# Patient Record
Sex: Female | Born: 1985 | Race: White | Hispanic: No | Marital: Single | Smoking: Light tobacco smoker
Health system: Southern US, Community
[De-identification: ages and names within clinical notes are randomized; demographics above are authoritative.]

## PROBLEM LIST (undated history)

## (undated) DIAGNOSIS — R87629 Unspecified abnormal cytological findings in specimens from vagina: Secondary | ICD-10-CM

## (undated) DIAGNOSIS — F99 Mental disorder, not otherwise specified: Secondary | ICD-10-CM

## (undated) DIAGNOSIS — D649 Anemia, unspecified: Secondary | ICD-10-CM

## (undated) DIAGNOSIS — B009 Herpesviral infection, unspecified: Secondary | ICD-10-CM

## (undated) HISTORY — DX: Anemia, unspecified: D64.9

## (undated) HISTORY — DX: Unspecified abnormal cytological findings in specimens from vagina: R87.629

## (undated) HISTORY — DX: Herpesviral infection, unspecified: B00.9

## (undated) HISTORY — PX: NO PAST SURGERIES: SHX2092

## (undated) HISTORY — DX: Mental disorder, not otherwise specified: F99

---

## 2005-05-18 ENCOUNTER — Emergency Department: Payer: Self-pay | Admitting: Emergency Medicine

## 2005-08-17 ENCOUNTER — Emergency Department: Payer: Self-pay | Admitting: Unknown Physician Specialty

## 2005-12-10 ENCOUNTER — Emergency Department: Payer: Self-pay | Admitting: Emergency Medicine

## 2005-12-30 ENCOUNTER — Ambulatory Visit: Payer: Self-pay | Admitting: Internal Medicine

## 2006-04-29 ENCOUNTER — Emergency Department: Payer: Self-pay | Admitting: Emergency Medicine

## 2006-05-24 ENCOUNTER — Emergency Department: Payer: Self-pay | Admitting: Emergency Medicine

## 2006-07-27 ENCOUNTER — Emergency Department: Payer: Self-pay | Admitting: Emergency Medicine

## 2006-11-09 ENCOUNTER — Emergency Department: Payer: Self-pay | Admitting: Emergency Medicine

## 2007-05-29 ENCOUNTER — Ambulatory Visit: Payer: Self-pay

## 2007-11-05 ENCOUNTER — Other Ambulatory Visit: Payer: Self-pay

## 2007-11-05 ENCOUNTER — Inpatient Hospital Stay: Payer: Self-pay

## 2009-09-09 ENCOUNTER — Emergency Department: Payer: Self-pay | Admitting: Emergency Medicine

## 2009-09-11 ENCOUNTER — Emergency Department: Payer: Self-pay | Admitting: Emergency Medicine

## 2009-11-03 ENCOUNTER — Emergency Department: Payer: Self-pay | Admitting: Emergency Medicine

## 2009-12-16 ENCOUNTER — Observation Stay: Payer: Self-pay

## 2010-02-20 ENCOUNTER — Observation Stay: Payer: Self-pay

## 2010-03-10 ENCOUNTER — Observation Stay: Payer: Self-pay

## 2010-03-25 ENCOUNTER — Inpatient Hospital Stay: Payer: Self-pay

## 2010-06-09 ENCOUNTER — Emergency Department: Payer: Self-pay | Admitting: Emergency Medicine

## 2010-07-10 ENCOUNTER — Emergency Department: Payer: Self-pay | Admitting: Emergency Medicine

## 2011-10-17 ENCOUNTER — Emergency Department: Payer: Self-pay | Admitting: Emergency Medicine

## 2011-10-22 ENCOUNTER — Emergency Department: Payer: Self-pay | Admitting: Emergency Medicine

## 2011-11-27 LAB — HM PAP SMEAR

## 2012-02-26 ENCOUNTER — Ambulatory Visit: Payer: Self-pay | Admitting: Internal Medicine

## 2012-07-10 ENCOUNTER — Emergency Department: Payer: Self-pay | Admitting: Emergency Medicine

## 2012-07-10 LAB — COMPREHENSIVE METABOLIC PANEL
Anion Gap: 10 (ref 7–16)
BUN: 8 mg/dL (ref 7–18)
Bilirubin,Total: 0.3 mg/dL (ref 0.2–1.0)
Chloride: 101 mmol/L (ref 98–107)
Co2: 27 mmol/L (ref 21–32)
Creatinine: 0.65 mg/dL (ref 0.60–1.30)
EGFR (African American): >60
EGFR (Non-African Amer.): >60
Potassium: 3.5 mmol/L (ref 3.5–5.1)
SGOT(AST): 52 U/L — ABNORMAL HIGH (ref 15–37)
SGPT (ALT): 44 U/L
Total Protein: 8.4 g/dL — ABNORMAL HIGH (ref 6.4–8.2)

## 2012-07-10 LAB — CBC WITH DIFFERENTIAL/PLATELET
Basophil #: 0 10*3/uL (ref 0.0–0.1)
Eosinophil %: 2.4 %
HCT: 38 % (ref 35.0–47.0)
Lymphocyte #: 2.3 10*3/uL (ref 1.0–3.6)
MCH: 29.6 pg (ref 26.0–34.0)
MCV: 89 fL (ref 80–100)
Monocyte %: 6.3 %
Neutrophil #: 5.9 10*3/uL (ref 1.4–6.5)
RBC: 4.27 10*6/uL (ref 3.80–5.20)
RDW: 11.9 % (ref 11.5–14.5)
WBC: 9 10*3/uL (ref 3.6–11.0)

## 2012-10-28 ENCOUNTER — Ambulatory Visit: Payer: Self-pay | Admitting: Physician Assistant

## 2013-08-17 ENCOUNTER — Emergency Department: Payer: Self-pay | Admitting: Emergency Medicine

## 2013-08-17 LAB — URINALYSIS, COMPLETE
Blood: NEGATIVE
Glucose,UR: NEGATIVE mg/dL (ref 0–75)
Ketone: NEGATIVE
Squamous Epithelial: 1
WBC UR: <1 /HPF (ref 0–5)

## 2013-08-17 LAB — CBC
MCHC: 34.8 g/dL (ref 32.0–36.0)
RBC: 3.97 10*6/uL (ref 3.80–5.20)
RDW: 13.2 % (ref 11.5–14.5)
WBC: 12.9 10*3/uL — ABNORMAL HIGH (ref 3.6–11.0)

## 2013-10-20 ENCOUNTER — Observation Stay: Payer: Self-pay

## 2013-10-20 LAB — URINALYSIS, COMPLETE
Bilirubin,UR: NEGATIVE
Ketone: NEGATIVE
Protein: NEGATIVE

## 2013-11-05 ENCOUNTER — Observation Stay: Payer: Self-pay

## 2013-11-05 LAB — URINALYSIS, COMPLETE
Bilirubin,UR: NEGATIVE
Blood: NEGATIVE
Ketone: NEGATIVE
Nitrite: NEGATIVE
Ph: 6 (ref 4.5–8.0)
RBC,UR: 3 /HPF (ref 0–5)
Specific Gravity: 1.017 (ref 1.003–1.030)
Squamous Epithelial: 10

## 2014-01-10 ENCOUNTER — Inpatient Hospital Stay: Payer: Self-pay | Admitting: Obstetrics and Gynecology

## 2014-01-10 LAB — CBC WITH DIFFERENTIAL/PLATELET
Basophil #: 0 10*3/uL (ref 0.0–0.1)
Basophil %: 0.4 %
Eosinophil #: 0.1 10*3/uL (ref 0.0–0.7)
Eosinophil %: 0.9 %
HCT: 33.9 % — ABNORMAL LOW (ref 35.0–47.0)
HGB: 11.5 g/dL — ABNORMAL LOW (ref 12.0–16.0)
Lymphocyte #: 2 10*3/uL (ref 1.0–3.6)
Lymphocyte %: 20.8 %
MCH: 27.9 pg (ref 26.0–34.0)
MCHC: 33.8 g/dL (ref 32.0–36.0)
MCV: 83 fL (ref 80–100)
MONOS PCT: 4.8 %
Monocyte #: 0.5 x10 3/mm (ref 0.2–0.9)
NEUTROS PCT: 73.1 %
Neutrophil #: 7 10*3/uL — ABNORMAL HIGH (ref 1.4–6.5)
Platelet: 162 10*3/uL (ref 150–440)
RBC: 4.11 10*6/uL (ref 3.80–5.20)
RDW: 15.1 % — ABNORMAL HIGH (ref 11.5–14.5)
WBC: 9.6 10*3/uL (ref 3.6–11.0)

## 2014-01-10 LAB — GLUCOSE, RANDOM: Glucose: 74 mg/dL (ref 65–99)

## 2014-01-11 LAB — HEMATOCRIT: HCT: 33.1 % — ABNORMAL LOW (ref 35.0–47.0)

## 2014-04-11 ENCOUNTER — Ambulatory Visit: Payer: Self-pay | Admitting: Internal Medicine

## 2014-05-22 IMAGING — US ABDOMEN ULTRASOUND LIMITED
1 series · 14 of 25 positions shown · non-contrast
Comparison: none

REASON FOR EXAM: RUQ gallbladder  bloating nausea and fullness
COMMENTS:

[Series 1: abdomen ultrasound limited · 0.31mm/px · 14 of 46 slices shown]
[im 1/46]
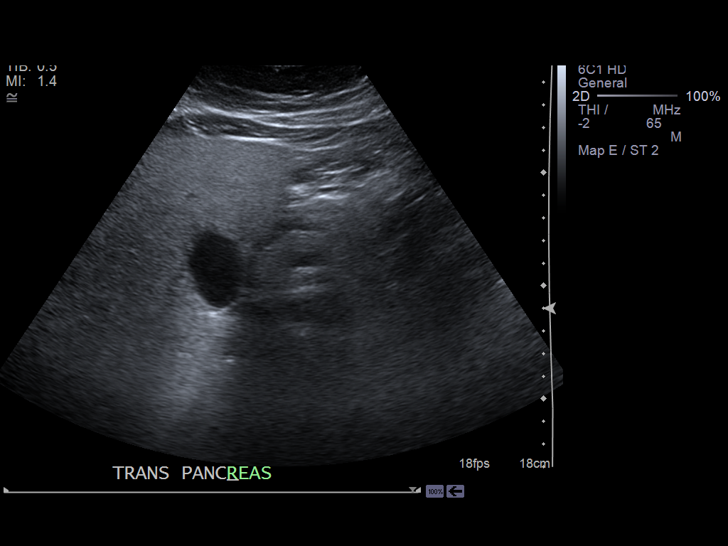
[im 4/46]
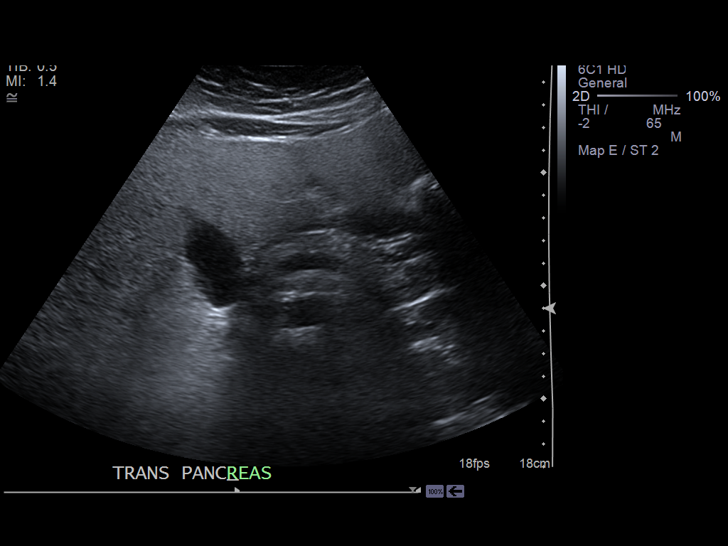
[im 8/46]
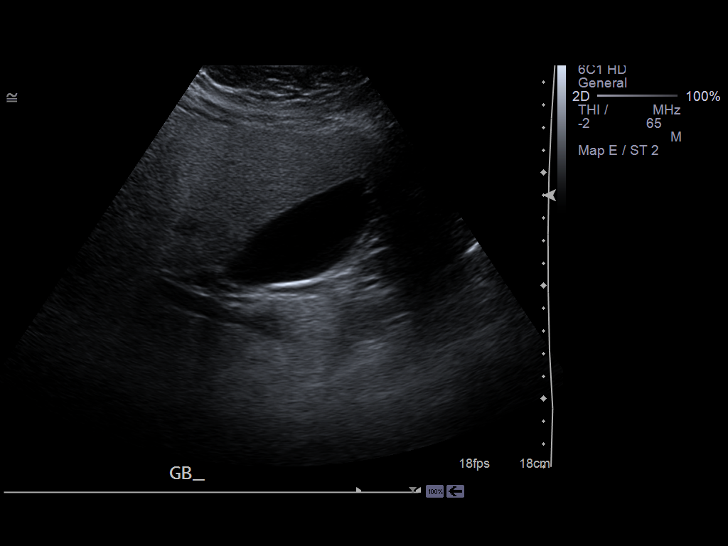
[im 12/46]
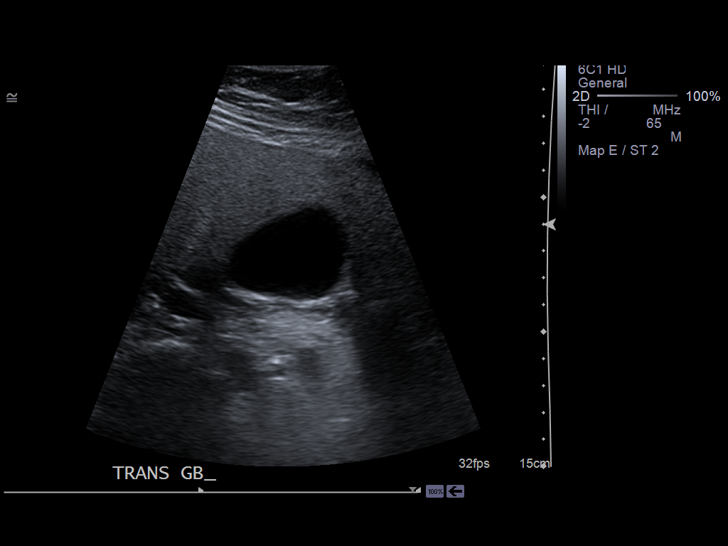
[im 16/46]
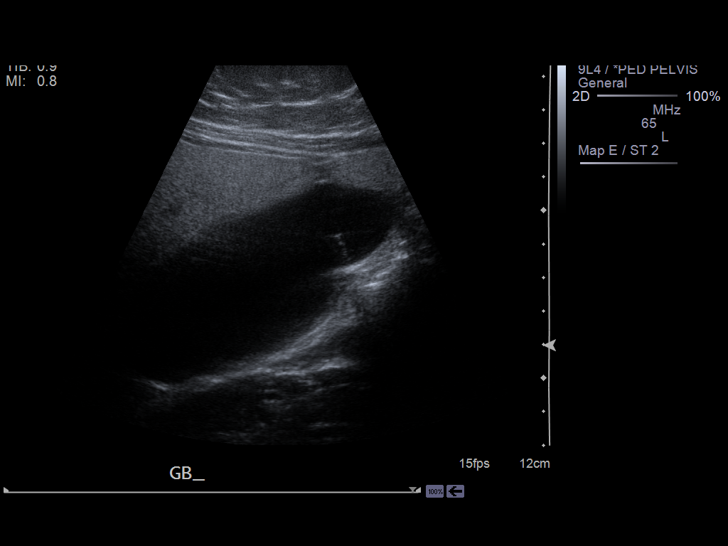
[im 17/46]
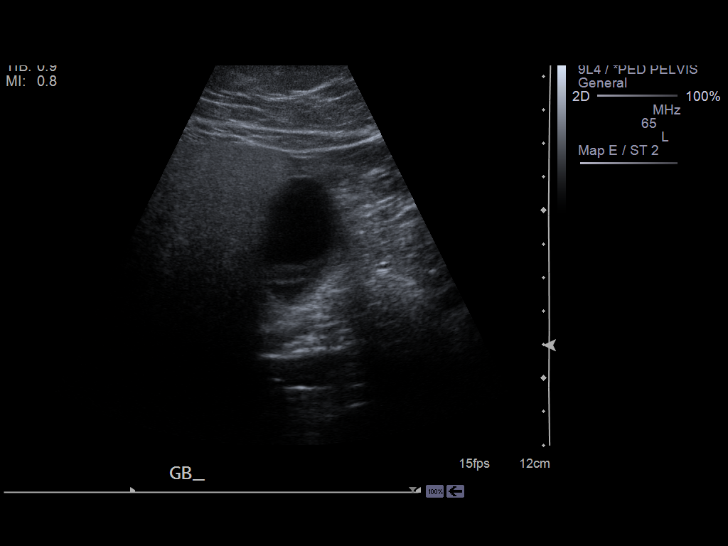
[im 21/46]
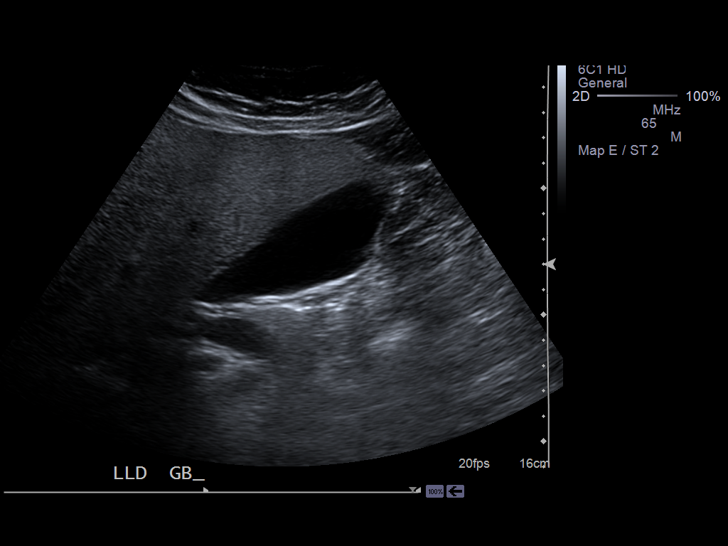
[im 25/46]
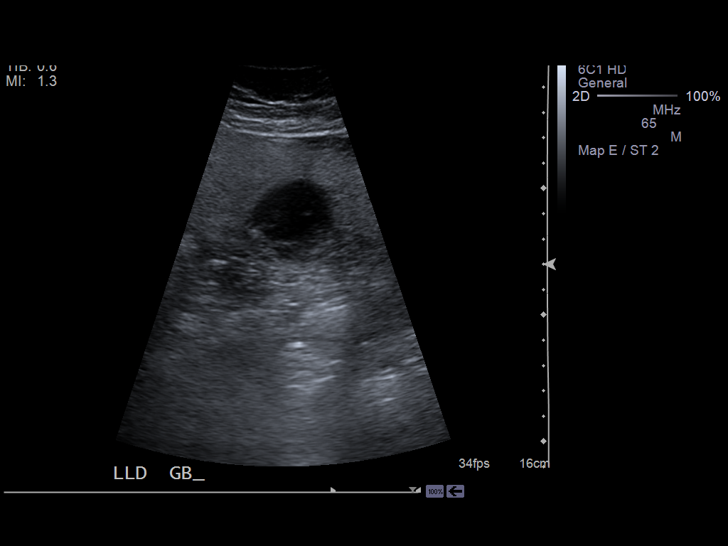
[im 29/46]
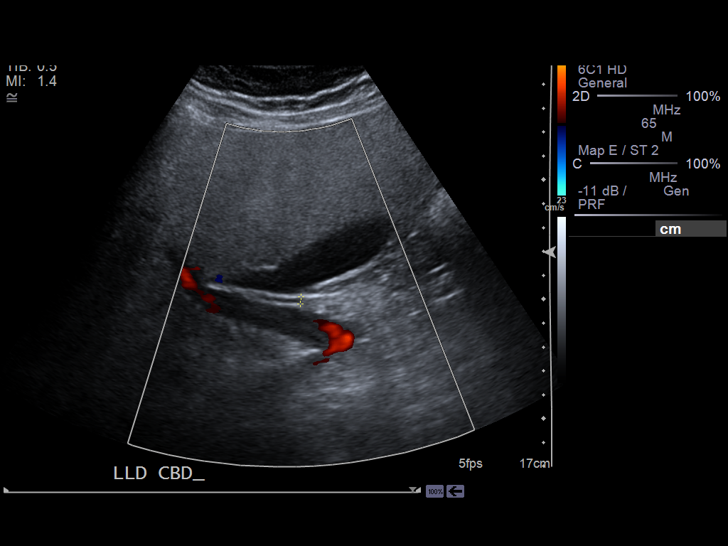
[im 31/46]
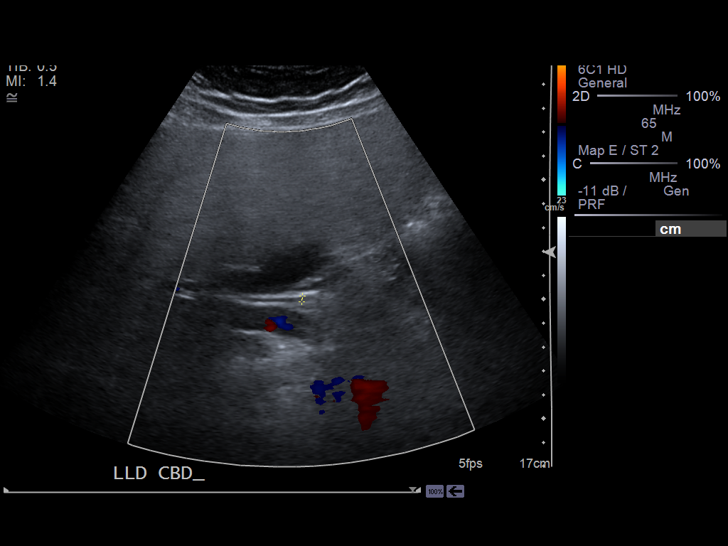
[im 34/46]
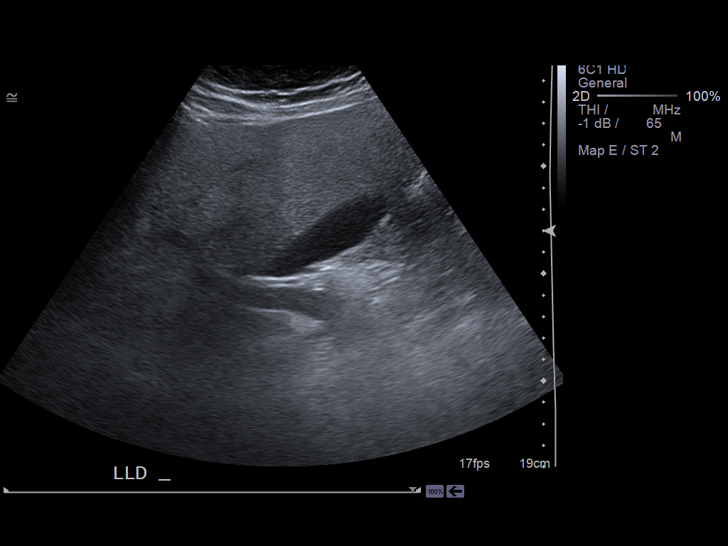
[im 38/46]
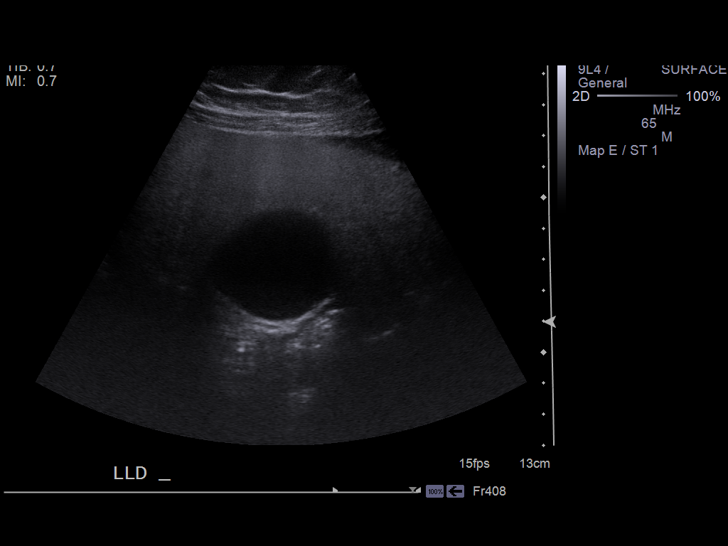
[im 42/46]
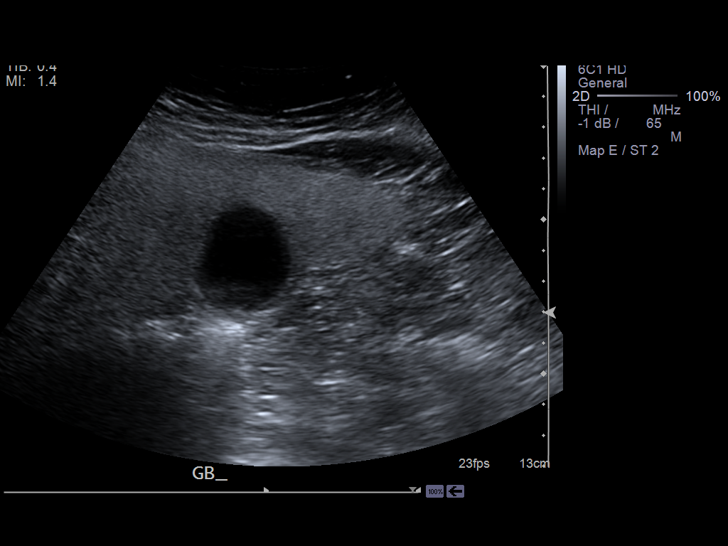
[im 46/46]
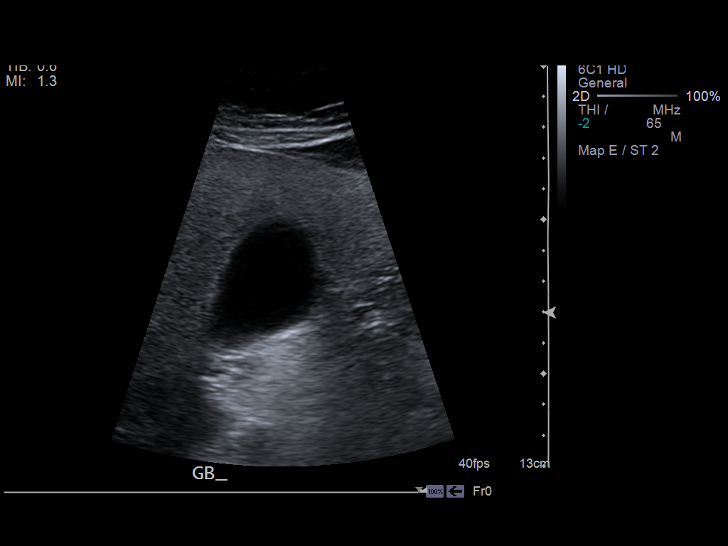

[14 of 25 positions shown; findings below may reference images not displayed]

PROCEDURE:     US  - US ABDOMEN LIMITED SURVEY  - October 28, 2012  [DATE]

RESULT:     Limited right upper quadrant abdominal sonogram is performed.
The liver shows a increased echotexture. The common bile duct is 3.0 mm.
Portal venous flow is normal. Gallbladder wall thickness is 1.6 mm. There
appears to be a possible prominent fold in the gallbladder near the fundus.
A phrygian cap is not excluded. The visualized pancreatic head appears to be
unremarkable. There is no pericholecystic fluid or sonographic Murphy's sign.
IMPRESSION: No significant abnormality. No stones are identified.
Probable fatty infiltration of the liver.

[REDACTED]

## 2014-05-22 IMAGING — NM NUCLEAR MEDICINE HEPATOHBILIARY INCLUDE GB
2 series · 14 of 14 positions shown · non-contrast
Comparison: none

REASON FOR EXAM: RUQ gallbladder  bloating nausea and fullness
COMMENTS:

[Series 1000: gallbladder ef · 4.80mm/px · 6 of 30 frames shown]
[frame 3/30]
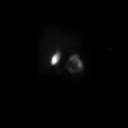
[frame 8/30]
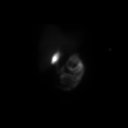
[frame 13/30]
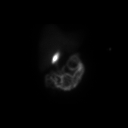
[frame 18/30]
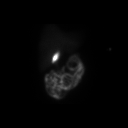
[frame 23/30]
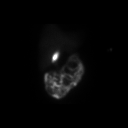
[frame 28/30]
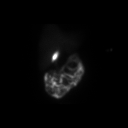

[Series 1000: gallbladder statics · 4.80mm/px · 8 of 8 slices shown]
[im 1/8]
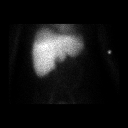
[im 2/8]
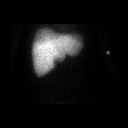
[im 3/8]
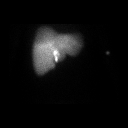
[im 4/8]
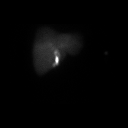
[im 5/8]
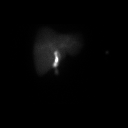
[im 6/8]
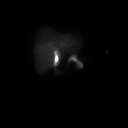
[im 7/8]
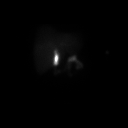
[im 8/8]
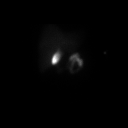

[14 of 14 positions shown; findings below may reference images not displayed]

PROCEDURE:     NM  - NM HEPATO WITH GB EJECT FRACTION  - October 28, 2012  [DATE]

RESULT:     The patient received a dose of 8.082 mCi of technetium 99 M
Choletec. There is prompt extraction of tracer from the blood pool by the
liver. Common bile duct activity is present 10 minutes. At 15 minutes there
is early common bile duct and gallbladder localization. Small bowel
localization is present by 20 minutes. There is progressive filling of the
gallbladder throughout the study. There is clearing of activity from the
hepatic parenchyma. A 1.5 uCi intravenous dose of sincalide was administered
over 30 minutes. Gallbladder ejection fraction is 64 stent. The patient
experienced minimal discomfort during the sincalide administration measuring
2 on scale of 1 to 10. Gallbladder ejection fraction is 64%.
IMPRESSION: Normal-appearing hepatobiliary scan. Gallbladder ejection
fraction is 64% in response to sincalide infusion.

[REDACTED]

## 2015-05-08 NOTE — H&P (Signed)
L&D Evaluation:  History:  HPI 29yo SWF presents at 7342w3d with SPROM of clear fluid at 930am today. [id number] with previous 36 week delivery of well infant. PNC complicated by GDM and noncompliant f/u and education for GDM. Last u/s revealed EFW 84% with AFI 11cm.   Presents with leaking fluid   Patient's Medical History other  Anxiety with depression   Patient's Surgical History none   Medications Pre Natal Vitamins  Tylenol (Acetaminophen)  zofran prn, Phenergan prn,   Allergies NKDA   Social History none   Family History Non-Contributory   ROS:  ROS All systems were reviewed.  HEENT, CNS, GI, GU, Respiratory, CV, Renal and Musculoskeletal systems were found to be normal.   Exam:  Vital Signs stable   Urine Protein not completed   General no apparent distress   Mental Status clear   Chest clear   Heart normal sinus rhythm   Abdomen gravid, non-tender   Estimated Fetal Weight Average for gestational age   Fetal Position vtx   Back no CVAT   Edema no edema   Mebranes Ruptured   Description clear   FHT normal rate with no decels   Fetal Heart Rate 125   Ucx irregular   Ucx Frequency 5 min   Length of each Contraction 60 seconds   Ucx Pain Scale 8   Impression:  Impression PPROM   Plan:  Plan monitor contractions and for cervical change, antibiotics for GBBS prophylaxis, fluids   Electronic Signatures: Ulyses AmorBurr, Ladean Steinmeyer N (CNM)  (Signed 13-Jan-15 13:36)  Authored: L&D Evaluation   Last Updated: 13-Jan-15 13:36 by Ulyses AmorBurr, Kaleisha Bhargava N (CNM)

## 2015-05-08 NOTE — H&P (Signed)
L&D Evaluation:  History:  HPI G2P1 at 3425 weeks gestation   Presents with abdominal pain   Patient's Medical History Recurrent UTI;Anxiety   Medications Pre Natal Vitamins   Allergies NKDA   Social History none   Family History Non-Contributory   ROS:  ROS No   General normal   HEENT normal   CNS normal   GI normal, constipation   GU normal, no uti sxs   Resp normal   CV normal   Renal normal   MS normal   Exam:  Vital Signs stable   Urine Protein negative dipstick, +1 Leucocytes; 1+ Bacteria   General no apparent distress   Mental Status clear   Heart normal sinus rhythm   Abdomen gravid, non-tender   Back no CVAT   Edema no edema   Pelvic no external lesions, cervix closed and thick   Mebranes Intact   FHT normal rate with no decels   FHT Description NST Reactive   Ucx absent   Skin dry   Lymph no lymphadenopathy   Impression:  Impression reactive NST, 25 week Intrauterine pregnancy; Non acute abdominal pain   Plan:  Plan UA, discharge, Urine culture   Follow Up Appointment already scheduled   Electronic Signatures: DeFrancesco, Prentice DockerMartin A (MD)  (Signed [phone]-Nov-14 21:54)  Authored: L&D Evaluation   Last Updated: 08-Nov-14 21:54 by DeFrancesco, Prentice DockerMartin A (MD)

## 2017-01-31 ENCOUNTER — Encounter: Payer: Self-pay | Admitting: Emergency Medicine

## 2017-01-31 ENCOUNTER — Emergency Department
Admission: EM | Admit: 2017-01-31 | Discharge: 2017-01-31 | Disposition: A | Discharge status: Home / Self Care | Payer: Self-pay | Attending: Emergency Medicine | Admitting: Emergency Medicine

## 2017-01-31 DIAGNOSIS — R05 Cough: Secondary | ICD-10-CM | POA: Insufficient documentation

## 2017-01-31 DIAGNOSIS — R69 Illness, unspecified: Secondary | ICD-10-CM

## 2017-01-31 DIAGNOSIS — J111 Influenza due to unidentified influenza virus with other respiratory manifestations: Secondary | ICD-10-CM

## 2017-01-31 DIAGNOSIS — R5383 Other fatigue: Secondary | ICD-10-CM | POA: Insufficient documentation

## 2017-01-31 DIAGNOSIS — R509 Fever, unspecified: Secondary | ICD-10-CM | POA: Insufficient documentation

## 2017-01-31 DIAGNOSIS — R0981 Nasal congestion: Secondary | ICD-10-CM | POA: Insufficient documentation

## 2017-01-31 DIAGNOSIS — F17210 Nicotine dependence, cigarettes, uncomplicated: Secondary | ICD-10-CM | POA: Insufficient documentation

## 2017-01-31 DIAGNOSIS — M791 Myalgia: Secondary | ICD-10-CM | POA: Insufficient documentation

## 2017-01-31 MED ORDER — OSELTAMIVIR PHOSPHATE 75 MG PO CAPS: 75.0000 mg | ORAL_CAPSULE | Freq: Two times a day (BID) | ORAL | 0 refills | Status: DC

## 2017-01-31 NOTE — ED Provider Notes (Signed)
[facility] Emergency Department Provider Note  ____________________________________________  Time seen: Approximately 11:30 AM  I have reviewed the triage vital signs and the nursing notes.   HISTORY  Chief Complaint Cough    HPI Allison Delgado is a 31 y.o. female , NAD, presents to the emergency department for evaluation of 3 day history of flu like symptoms. States over the last 2-3 days she has had fatigue, mild upper body aches, nasal congestion, runny nose and a cough. Did take Alka-Seltzer cold medication this morning which seemed to help some. Did feel that she had a fever over the last couple of days with chills. Denies any chest pain, shortness of breath or wheezing. No abdominal pain, nausea or vomiting but does note that she had increased urinary frequency and dysuria approximately one week ago that resolved after taking over-the-counter AZO. Has had no further episodes of urinary frequency or dysuria. Has not noted any hematuria. 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 Is concerned that she has flu and would like treatment.   History reviewed. No pertinent past medical history.  There are no active problems to display for this patient.   History reviewed. No pertinent surgical history.  Prior to Admission medications   Medication Sig Start Date End Date Taking? Authorizing Provider  oseltamivir (TAMIFLU) 75 MG capsule Take 1 capsule (75 mg total) by mouth 2 (two) times daily. 01/31/17   Tria Noguera L Susa Bones, PA-C    Allergies Patient has no known allergies.  No family history on file.  Social History Social History  Substance Use Topics  . Smoking status: Current Every Day Smoker    Packs/day: 0.10    Types: Cigarettes  . Smokeless tobacco: Not on file  . Alcohol use Not on file     Review of Systems  Constitutional: Positive subjective fevers, chills, fatigue. Eyes: No visual  changes. No discharge ENT: Positive nasal congestion, runny nose, ear pressure. No sore throat. Cardiovascular: No chest pain. Respiratory: Positive nonproductive cough. No shortness of breath. No wheezing.  Gastrointestinal: No abdominal pain.  No nausea, vomiting.  No diarrhea.  No constipation. Genitourinary: Negative for dysuria. No hematuria. No urinary hesitancy, urgency or increased frequency. Musculoskeletal: Positive for general myalgias.  Skin: Negative for rash. Neurological: Negative for headaches. 10-point ROS otherwise negative.  ____________________________________________   PHYSICAL EXAM:  VITAL SIGNS: ED Triage Vitals  Enc Vitals Group     BP 01/31/17 1055 137/67     Pulse Rate 01/31/17 1055 100     Resp 01/31/17 1055 18     Temp 01/31/17 1055 98.1 F (36.7 C)     Temp Source 01/31/17 1055 Oral     SpO2 01/31/17 1055 98 %     Weight 01/31/17 1057 170 lb (77.1 kg)     Height 01/31/17 1057 4\' 11"  (1.499 m)     Head Circumference --      Peak Flow --      Pain Score 01/31/17 1057 6     Pain Loc --      Pain Edu? --      Excl. in GC? --      Constitutional: Alert and oriented. Well appearing and in no acute distress. Eyes: Conjunctivae are normal.  Head: Atraumatic. ENT:      Ears: TMs visualized bilaterally without erythema, effusion, bulging, perforation.      Nose: No congestion/rhinnorhea.      Mouth/Throat:  Mucous membranes are moist. Pharynx without erythema, swelling. Uvula is midline. Airway is patent. Clear postnasal drainage. Neck: Supple with full range of motion. Hematological/Lymphatic/Immunilogical: No cervical lymphadenopathy. Cardiovascular: Normal rate, regular rhythm. Normal S1 and S2.  Good peripheral circulation. Respiratory: Normal respiratory effort without tachypnea or retractions. Lungs CTAB with breath sounds noted in all lung fields. No wheeze, rhonchi, rales Neurologic:  Normal speech and language. No gross focal neurologic  deficits are appreciated.  Skin:  Skin is warm, dry and intact. No rash noted. Psychiatric: Mood and affect are normal. Speech and behavior are normal. Patient exhibits appropriate insight and judgement.   ____________________________________________   LABS  None ____________________________________________  EKG  None ____________________________________________  RADIOLOGY  None ____________________________________________    PROCEDURES  Procedure(s) performed: None   Procedures   Medications - No data to display   ____________________________________________   INITIAL IMPRESSION / ASSESSMENT AND PLAN / ED COURSE  Pertinent labs & imaging results that were available during my care of the patient were reviewed by me and considered in my medical decision making (see chart for details).     Patient's diagnosis is consistent with influenza-like illness. Had a discussion with the patient that her symptoms did not particularly following the 48 hour window in which Tamiflu would be effective. Patient states she would prefer to take the Tamiflu versus not at this time due to having children at home. Patient will be discharged home with prescriptions for Tamiflu to take as directed. May continue over-the-counter cold or cough remedies as needed. [facility] if symptoms persist past this treatment course. Patient is given ED precautions to return to the ED for any worsening or new symptoms.   ____________________________________________  FINAL CLINICAL IMPRESSION(S) / ED DIAGNOSES  Final diagnoses:  Influenza-like illness      NEW MEDICATIONS STARTED DURING THIS VISIT:  Discharge Medication List as of 01/31/2017 11:39 AM    START taking these medications   Details  oseltamivir (TAMIFLU) 75 MG capsule Take 1 capsule (75 mg total) by mouth 2 (two) times daily., Starting Sat 01/31/2017, Print             Ernestene KielJami L AudubonHagler,  PA-C 01/31/17 1250    Jennye MoccasinBrian S Quigley, MD 01/31/17 1256

## 2017-01-31 NOTE — ED Triage Notes (Signed)
Cough, body aches x 2 days

## 2017-09-13 ENCOUNTER — Encounter: Payer: Self-pay | Admitting: Emergency Medicine

## 2017-09-13 ENCOUNTER — Emergency Department
Admission: EM | Admit: 2017-09-13 | Discharge: 2017-09-13 | Disposition: A | Discharge status: Home / Self Care | Payer: Self-pay | Attending: Emergency Medicine | Admitting: Emergency Medicine

## 2017-09-13 DIAGNOSIS — N390 Urinary tract infection, site not specified: Secondary | ICD-10-CM | POA: Insufficient documentation

## 2017-09-13 NOTE — ED Notes (Signed)
Pt called for in lobby, BR, and outside building. Pt not found.

## 2017-09-13 NOTE — ED Triage Notes (Signed)
Pt arrives via [facility] with c/o recurrent UTI and multiple medical complaints. Pt is having concerns with multiple parts of her body including but not limited to her ears, tongue, legs, and head. Pt is ambulatory to triage and in NAD.

## 2018-07-06 ENCOUNTER — Telehealth: Payer: Self-pay | Admitting: Obstetrics and Gynecology

## 2018-07-06 NOTE — Telephone Encounter (Signed)
The patient called and stated that she would like to speak with a nurse in regards to a medication she is currently taking while pregnant. The patient also stated that she is planning on starting prenatal care with us, and that she was a previous patient here some years ago. No other information was disclosed. Please advise.

## 2018-07-07 NOTE — Telephone Encounter (Signed)
Called pt from the number on file [phone] and a female answer the phone and when asked for pt he stated that I had to wrong number.

## 2018-07-20 ENCOUNTER — Encounter: Payer: Self-pay | Admitting: Maternal Newborn

## 2020-02-10 ENCOUNTER — Ambulatory Visit (LOCAL_COMMUNITY_HEALTH_CENTER): Payer: Medicaid Other | Admitting: Physician Assistant

## 2020-02-10 ENCOUNTER — Encounter: Payer: Self-pay | Admitting: Physician Assistant

## 2020-02-10 ENCOUNTER — Ambulatory Visit: Payer: Medicaid Other | Admitting: Physician Assistant

## 2020-02-10 ENCOUNTER — Other Ambulatory Visit: Payer: Self-pay

## 2020-02-10 VITALS — BP 111/72 | Ht 59.25 in | Wt 162.0 lb

## 2020-02-10 DIAGNOSIS — Z3009 Encounter for other general counseling and advice on contraception: Secondary | ICD-10-CM

## 2020-02-10 DIAGNOSIS — Z113 Encounter for screening for infections with a predominantly sexual mode of transmission: Secondary | ICD-10-CM | POA: Diagnosis not present

## 2020-02-10 DIAGNOSIS — Z30012 Encounter for prescription of emergency contraception: Secondary | ICD-10-CM

## 2020-02-10 DIAGNOSIS — Z30011 Encounter for initial prescription of contraceptive pills: Secondary | ICD-10-CM | POA: Diagnosis not present

## 2020-02-10 DIAGNOSIS — O24419 Gestational diabetes mellitus in pregnancy, unspecified control: Secondary | ICD-10-CM | POA: Insufficient documentation

## 2020-02-10 LAB — WET PREP FOR TRICH, YEAST, CLUE
Trichomonas Exam: NEGATIVE
Yeast Exam: NEGATIVE

## 2020-02-10 MED ORDER — LEVONORGESTREL 1.5 MG PO TABS: 1.5000 mg | ORAL_TABLET | Freq: Once | ORAL | 0 refills | Status: AC

## 2020-02-10 MED ORDER — NORGESTIM-ETH ESTRAD TRIPHASIC 0.18/0.215/0.25 MG-35 MCG PO TABS
1.0000 | ORAL_TABLET | Freq: Every day | ORAL | 3 refills | Status: DC
Start: 2020-02-11 — End: 2020-05-29

## 2020-02-10 NOTE — Progress Notes (Signed)
[facility] Department STI clinic/screening visit  Subjective:  Allison Delgado is a 34 y.o. female being seen today for an STI screening visit. The patient reports they do not have symptoms.  Patient reports that they do not desire a pregnancy in the next year.   They reported they are interested in discussing contraception today.  Patient's last menstrual period was 02/04/2020 (approximate).   Patient has the following medical conditions:  There are no problems to display for this patient.   Chief Complaint  Patient presents with  . SEXUALLY TRANSMITTED DISEASE    HPI  Patient reports that she may have a retained tampon.  States she put in a tampon almost 48 hr ago and does not remember taking it out.  Would like to be checked to see if there is a tampon in her vagina and for infections.  States that she would also like Plan B and OCs to start.  See flowsheet for further details and programmatic requirements.    The following portions of the patient's history were reviewed and updated as appropriate: allergies, current medications, past medical history, past social history, past surgical history and problem list.  Objective:   Vitals:   02/10/20 1631  BP: 111/72  Weight: 162 lb (73.5 kg)  Height: 4' 11.25" (1.505 m)    Physical Exam Vitals reviewed.  Constitutional:      General: She is not in acute distress.    Appearance: Normal appearance. She is normal weight.  HENT:     Head: Normocephalic and atraumatic.     Comments: No nits, lice, or hair loss. No cervical, supraclavicular or axillary adenopathy.    Mouth/Throat:     Mouth: Mucous membranes are moist.     Pharynx: Oropharynx is clear. No oropharyngeal exudate or posterior oropharyngeal erythema.  Eyes:     Conjunctiva/sclera: Conjunctivae normal.  Pulmonary:     Effort: Pulmonary effort is normal.  Abdominal:     Palpations: Abdomen is soft. There is no mass.     Tenderness: There is no  abdominal tenderness. There is no guarding or rebound.  Genitourinary:    General: Normal vulva.     Rectum: Normal.     Comments: External genitalia/pubic area without nits, lice, edema, erythema, lesions and inguinal adenopathy. Vagina with normal mucosa and discharge. No tampon visualized or palpated in vagina. Cervix without visible lesions. Uterus firm, mobile, nt, no masses, no CMT, no adnexal tenderness or fullness. Musculoskeletal:     Cervical back: Neck supple. No tenderness.  Skin:    General: Skin is warm and dry.     Findings: No bruising, erythema, lesion or rash.     Comments: Multiple tattoos.  Neurological:     Mental Status: She is alert and oriented to person, place, and time.  Psychiatric:        Mood and Affect: Mood normal.        Behavior: Behavior normal.        Thought Content: Thought content normal.        Judgment: Judgment normal.      Assessment and Plan:  Tamara Monteith is a 34 y.o. female presenting to the [facility] Department for STI screening  1. Screening for STD (sexually transmitted disease) Patient into clinic without symptoms.  Declines blood work today. Rec condoms with all sex. Await test results.  Counseled that RN will call if needs to RTC for treatment once results are back. - WET PREP FOR TRICH,  YEAST, CLUE - Chlamydia/Gonorrhea [facility]  2. Encounter for counseling regarding contraception Counseled re:  Risks, benefits, and SE of all BCMs.  Patient states that she is only interested in OCs and nothing else. Reports that she previously did the best on Ortho Tri Cyclen. - levonorgestrel (PLAN B ONE-STEP) 1.5 MG tablet; Take 1 tablet (1.5 mg total) by mouth once for 1 dose.  Dispense: 1 tablet; Refill: 0 - Norgestimate-Ethinyl Estradiol Triphasic (ORTHO TRI-CYCLEN, 28,) 0.18/0.215/0.25 MG-35 MCG tablet; Take 1 tablet by mouth daily.  Dispense: 1 Package; Refill: 3  3. OCP (oral contraceptive pills) initiation Rx  for Ortho Tri Cyclen 28d 1 po daily at the same time each day with refills x 3 sent to patient's pharmacy of choice. Counseled patient to start these OCs on 02/11/2020, after taking ECP today. Rec condoms with all sex for 2 weeks and check OTC pregnancy test in 2 weeks, RTC if positive. - Norgestimate-Ethinyl Estradiol Triphasic (ORTHO TRI-CYCLEN, 28,) 0.18/0.215/0.25 MG-35 MCG tablet; Take 1 tablet by mouth daily.  Dispense: 1 Package; Refill: 3  4. Encounter for emergency contraceptive counseling and prescription Rx for Plan B sent to patient's pharmacy of choice. Written info on how to take and SE reviewed with and given to patient. RTC if pregnancy test in 2 weeks at home is positive. - levonorgestrel (PLAN B ONE-STEP) 1.5 MG tablet; Take 1 tablet (1.5 mg total) by mouth once for 1 dose.  Dispense: 1 tablet; Refill: 0     No follow-ups on file.  No future appointments.  Jerene Dilling, PA

## 2020-02-13 ENCOUNTER — Encounter: Payer: Self-pay | Admitting: Physician Assistant

## 2020-02-13 NOTE — Progress Notes (Signed)
Patient into clinic for IS visit and requested to get Plan B and start OCs.  Reported that she has tried several types of OC in the past and did best with Ortho Tri Cyclen.  Denies any chronic conditions, surgeries, smoking and history of migraines.  Per Centricity last RP at [facility] was in 2016, and patient denies changes to family history since then.  See IS document for vital signs and Rx.  Patient to RTC for RP when refills run out and will also need to have copy of pap to make sure when next due.

## 2020-02-22 ENCOUNTER — Telehealth: Payer: Self-pay | Admitting: Family Medicine

## 2020-02-22 NOTE — Telephone Encounter (Signed)
Per client, took Plan B 02/10/2020 and initiated ocps on 02/15/2020. Few hours after taking initial ocp had light vaginal spotting with heavier bleeding the next day requiring use of light panty liner. Also had menstrual cramps and bloated feeling. As 02/16/2020 progressed into evening, symptoms and bleeding resolved. Each night when takes ocp, begins spotting and above scenario occurs each day. Consult with Sadie Haber PA and client counseled that due to Plan B and pill initiation, may experience some irregular bleeding with first few pack of pills. Counseled could experience this irregular bleeding if Plan B not taken. Per Ms. Rolley Sims, client encouraged to continue ocp daily at same time every day. Client also to take home UPT this weekend as per discussion with provider ate 02/10/20 appt. Client to call clinic for any questions / concerns. Jossie Ng, RN

## 2020-05-28 ENCOUNTER — Other Ambulatory Visit: Payer: Self-pay | Admitting: Physician Assistant

## 2020-05-28 DIAGNOSIS — Z3009 Encounter for other general counseling and advice on contraception: Secondary | ICD-10-CM

## 2020-05-28 DIAGNOSIS — Z30011 Encounter for initial prescription of contraceptive pills: Secondary | ICD-10-CM

## 2020-05-29 NOTE — Telephone Encounter (Signed)
Patient seen for IS visit and given Plan B and OC start on 02/10/2020.  Last RP was 2016.  Needs RP visit prior to more OCs.

## 2020-06-27 ENCOUNTER — Other Ambulatory Visit: Payer: Self-pay | Admitting: Physician Assistant

## 2020-06-27 DIAGNOSIS — Z30011 Encounter for initial prescription of contraceptive pills: Secondary | ICD-10-CM

## 2020-06-27 DIAGNOSIS — Z3009 Encounter for other general counseling and advice on contraception: Secondary | ICD-10-CM

## 2020-06-28 NOTE — Telephone Encounter (Signed)
Patient seen through IS clinic in 01/2020 and given Plan B and Rx for OC with refills.  Will OK one refill but patient needs in-person visit for RP before any further refills.

## 2020-08-27 ENCOUNTER — Other Ambulatory Visit: Payer: Self-pay | Admitting: Physician Assistant

## 2020-08-27 DIAGNOSIS — Z30011 Encounter for initial prescription of contraceptive pills: Secondary | ICD-10-CM

## 2020-08-27 DIAGNOSIS — Z3009 Encounter for other general counseling and advice on contraception: Secondary | ICD-10-CM

## 2020-08-28 ENCOUNTER — Encounter: Payer: Self-pay | Admitting: Physician Assistant

## 2020-08-28 NOTE — Progress Notes (Signed)
Patient with refill request. Patient given Rx for OCs at IS visit in 01/2020.  Patient needs in-person RP visit through [facility] for further refills.

## 2020-08-29 ENCOUNTER — Telehealth: Payer: Self-pay | Admitting: Physician Assistant

## 2020-08-29 ENCOUNTER — Other Ambulatory Visit: Payer: Self-pay | Admitting: Physician Assistant

## 2020-08-29 DIAGNOSIS — Z3009 Encounter for other general counseling and advice on contraception: Secondary | ICD-10-CM

## 2020-08-29 DIAGNOSIS — Z30011 Encounter for initial prescription of contraceptive pills: Secondary | ICD-10-CM

## 2020-08-29 NOTE — Telephone Encounter (Signed)
At approximately 3 pm patient left message on voice mail. At 3:33 pm attempted to call patient back and left another message that I would try again at around 5 pm.

## 2020-08-29 NOTE — Telephone Encounter (Signed)
Call back from patient and counseled about needing appointment for RP to restart/continue with OCs.  With help of Tawny Hopping, RN patient was made an appointment for an RP on 09/07/2020 at 3:40 pm.  Patient is aware of date and time and needs to check in at 3:10pm.

## 2020-08-29 NOTE — Telephone Encounter (Signed)
Have received several refill requests from pharmacy for patient for Tri-Sprintec.  Per chart review, patient was seen 01/2020 for an IS visit and was given Plan B and Rx for Tri-Sprintec with refills x 3.  At that time patient was told that she would need to RTC for a RP in [facility] to continue being prescribed OCs by [facility].  Patient has not been seen for RP.

## 2020-08-30 ENCOUNTER — Other Ambulatory Visit: Payer: Self-pay | Admitting: Physician Assistant

## 2020-08-30 DIAGNOSIS — Z3041 Encounter for surveillance of contraceptive pills: Secondary | ICD-10-CM

## 2020-08-30 MED ORDER — NORGESTIM-ETH ESTRAD TRIPHASIC 0.18/0.215/0.25 MG-35 MCG PO TABS: 1.0000 | ORAL_TABLET | Freq: Every day | ORAL | 0 refills | Status: AC

## 2020-08-30 NOTE — Progress Notes (Signed)
Patient called back this am and requested one more pack of OCs to last until her appointment.  Counseled patient that she would not get any further refills until she keeps her appointment.  Patient verbalizes understanding and agrees with plan.

## 2020-09-07 ENCOUNTER — Other Ambulatory Visit: Payer: Self-pay

## 2020-09-07 ENCOUNTER — Ambulatory Visit (LOCAL_COMMUNITY_HEALTH_CENTER): Payer: Medicaid Other | Admitting: Physician Assistant

## 2020-09-07 ENCOUNTER — Encounter: Payer: Self-pay | Admitting: Physician Assistant

## 2020-09-07 VITALS — BP 132/86 | Ht 60.5 in | Wt 155.4 lb

## 2020-09-07 DIAGNOSIS — Z309 Encounter for contraceptive management, unspecified: Secondary | ICD-10-CM | POA: Diagnosis not present

## 2020-09-07 DIAGNOSIS — Z3041 Encounter for surveillance of contraceptive pills: Secondary | ICD-10-CM

## 2020-09-07 DIAGNOSIS — Z0000 Encounter for general adult medical examination without abnormal findings: Secondary | ICD-10-CM | POA: Diagnosis not present

## 2020-09-07 DIAGNOSIS — Z30011 Encounter for initial prescription of contraceptive pills: Secondary | ICD-10-CM | POA: Diagnosis not present

## 2020-09-07 DIAGNOSIS — Z3009 Encounter for other general counseling and advice on contraception: Secondary | ICD-10-CM

## 2020-09-07 NOTE — Progress Notes (Signed)
Here today for PE and birth control. Last PE, Pap Smear and CBE here was 03/08/2015. Wants refill on OCP's. Wants STD screening today. Declines bloodwork. Tawny Hopping, RN

## 2020-09-08 ENCOUNTER — Encounter: Payer: Self-pay | Admitting: Physician Assistant

## 2020-09-08 ENCOUNTER — Other Ambulatory Visit: Payer: Self-pay | Admitting: Physician Assistant

## 2020-09-08 MED ORDER — NORGESTIM-ETH ESTRAD TRIPHASIC 0.18/0.215/0.25 MG-35 MCG PO TABS: 1.0000 | ORAL_TABLET | Freq: Every day | ORAL | 3 refills | Status: DC

## 2020-09-08 NOTE — Addendum Note (Signed)
Addended by: Sadie Haber on: 09/08/2020 03:53 PM   Modules accepted: Orders

## 2020-09-08 NOTE — Progress Notes (Addendum)
[facility] DEPARTMENT [facility] [address] Main Number: [phone]    Family Planning Visit- Initial Visit  Subjective:  Allison Delgado is a 34 y.o.  [id number]   being seen today for an initial well woman visit and to discuss family planning options.  She is currently using Combination OCPs for pregnancy prevention. Patient reports she does not want a pregnancy in the next year.  Patient has the following medical conditions has Gestational diabetes on their problem list.  Chief Complaint  Patient presents with  . Contraception    PE and OCs.    Patient reports that she has been doing well with the OCs and would like to continue with them.  Reports that she had some nausea last month that lasted for a few days but has now resolved.  States that she was evaluated by her PCP for vaginal odor/urinary odor and everything was normal.   Reports that she has had a pap at [facility] since her last one here but is not sure what year that was done.  Declines blood work and STD screening today and states that since she found out that she has "full Medicaid" that she plans to make an appointment with her PCP for a check and talk about a referral to a GYN for a BTL.  Requests Rx to continue with OCs for another 3 months.  PHQ-9=11 today without suicidal or homicidal ideation or plan.  Reports history of bipolar disorder and plans to schedule for appointment with a counselor.  Reports that her weight fluctuates.  Patient denies any other concerns today.    Body mass index is 29.85 kg/m. - Patient is eligible for diabetes screening based on BMI and age >73?  not applicable HA1C ordered? not applicable  Patient reports 2  partners in last year. Desires STI screening?  No - had negative results recently at PCP office.  Has patient been screened once for HCV in the past?  No  No results found for: HCVAB  Does the patient have current drug use (including  MJ), have a partner with drug use, and/or has been incarcerated since last result? No  If yes-- Screen for HCV through [facility]   Does the patient meet criteria for HBV testing? No  Criteria:  -Household, sexual or needle sharing contact with HBV -History of drug use -HIV positive -Those with known Hep C   Health Maintenance Due  Topic Date Due  . Hepatitis C Screening  Never done  . COVID-19 Vaccine (1) Never done  . HIV Screening  Never done  . TETANUS/TDAP  Never done  . PAP SMEAR-Modifier  03/07/2018  . INFLUENZA VACCINE  Never done    Review of Systems  All other systems reviewed and are negative.   The following portions of the patient's history were reviewed and updated as appropriate: allergies, current medications, past family history, past medical history, past social history, past surgical history and problem list. Problem list updated.   See flowsheet for other program required questions.  Objective:   Vitals:   09/07/20 1552  BP: 132/86  Weight: 155 lb 6.4 oz (70.5 kg)  Height: 5' 0.5" (1.537 m)    Physical Exam Vitals and nursing note reviewed.  Constitutional:      General: She is not in acute distress.    Appearance: Normal appearance.  HENT:     Head: Normocephalic and atraumatic.  Eyes:     Conjunctiva/sclera: Conjunctivae  normal.  Neck:     Thyroid: No thyroid mass, thyromegaly or thyroid tenderness.  Cardiovascular:     Rate and Rhythm: Normal rate and regular rhythm.  Pulmonary:     Effort: Pulmonary effort is normal.     Breath sounds: Normal breath sounds.  Chest:     Breasts:        Right: Normal. No mass, nipple discharge, skin change or tenderness.        Left: Normal. No mass, nipple discharge, skin change or tenderness.  Abdominal:     Palpations: Abdomen is soft. There is no mass.     Tenderness: There is no abdominal tenderness. There is no guarding or rebound.  Musculoskeletal:     Cervical back: Neck supple.   Lymphadenopathy:     Cervical: No cervical adenopathy.     Upper Body:     Right upper body: No supraclavicular, axillary or pectoral adenopathy.     Left upper body: No supraclavicular, axillary or pectoral adenopathy.  Skin:    General: Skin is warm and dry.  Neurological:     Mental Status: She is alert and oriented to person, place, and time.  Psychiatric:        Mood and Affect: Mood normal.        Behavior: Behavior normal.        Thought Content: Thought content normal.        Judgment: Judgment normal.       Assessment and Plan:  Allison Delgado is a 34 y.o. female presenting to the [facility] Department for an initial well woman exam/family planning visit  Contraception counseling: Reviewed all forms of birth control options in the tiered based approach. available including abstinence; over the counter/barrier methods; hormonal contraceptive medication including pill, patch, ring, injection,contraceptive implant, ECP; hormonal and nonhormonal IUDs; permanent sterilization options including vasectomy and the various tubal sterilization modalities. Risks, benefits, and typical effectiveness rates were reviewed.  Questions were answered.  Written information was also given to the patient to review.  Patient desires to continue with OCs, this was prescribed for patient. She will follow up in  1 year and prn for surveillance.  She was told to call with any further questions, or with any concerns about this method of contraception.  Emphasized use of condoms 100% of the time for STI prevention.  Patient was not a candidate for ECP today.   1. Encounter for counseling regarding contraception Counseled patient re:  Risks, benefits, and SE of OCs especially the risk of smoking and OCs at age at or over 80. Patient counseled that she would need to completely d/c smoking to continue on COCs that she currently takes.  Patient states that she only occasionally smokes and so  that since she is out of cigarettes it will not be a problem to not get more. Rec condoms with all sex for STD protection.  2. Well woman exam (no gynecological exam) Reviewed with patient healthy habits for general health. Enc MVI 1 po daily. Enc to follow up with PCP for primary care concerns, illness and age appropriate screenings. Counseled that if she is not able to find a counselor we do have one at our facility she can see and she can call back for an appointment if needed.  3. Surveillance of previously prescribed contraceptive pill Will send refills for Tri-Sprintec 28 d 1 po daily at the same time each day with 3 refills per patient request of 3 months of  refills. - Norgestimate-Ethinyl Estradiol Triphasic (TRI-SPRINTEC) 0.18/0.215/0.25 MG-35 MCG tablet; Take 1 tablet by mouth daily.  Dispense: 28 tablet; Refill: 3   No follow-ups on file.  No future appointments.  Matt Holmes, PA

## 2020-09-14 ENCOUNTER — Encounter: Payer: Self-pay | Admitting: Physician Assistant

## 2020-09-14 NOTE — Progress Notes (Signed)
Received records from [facility].  Per records, last RP and pap was 10/2011.

## 2021-01-15 ENCOUNTER — Other Ambulatory Visit: Payer: Self-pay | Admitting: Physician Assistant

## 2021-01-15 DIAGNOSIS — Z3041 Encounter for surveillance of contraceptive pills: Secondary | ICD-10-CM

## 2021-01-15 NOTE — Telephone Encounter (Signed)
Patient with RP in 08/2020.  Will OK 3 more refills for patient but will need call from patient to [facility] further refills.

## 2021-04-08 ENCOUNTER — Other Ambulatory Visit: Payer: Self-pay | Admitting: Physician Assistant

## 2021-04-08 DIAGNOSIS — Z3041 Encounter for surveillance of contraceptive pills: Secondary | ICD-10-CM

## 2023-06-23 ENCOUNTER — Ambulatory Visit: Payer: Medicaid Other

## 2023-06-24 ENCOUNTER — Other Ambulatory Visit: Payer: Self-pay

## 2023-06-24 MED ORDER — VALACYCLOVIR HCL 500 MG PO TABS: 500.0000 mg | ORAL_TABLET | Freq: Every day | ORAL | 11 refills | Status: DC

## 2023-06-24 NOTE — Telephone Encounter (Signed)
Error

## 2024-05-02 ENCOUNTER — Ambulatory Visit: Admitting: Cardiology

## 2024-06-06 DIAGNOSIS — O3680X0 Pregnancy with inconclusive fetal viability, not applicable or unspecified: Secondary | ICD-10-CM | POA: Diagnosis not present

## 2024-06-06 DIAGNOSIS — O0990 Supervision of high risk pregnancy, unspecified, unspecified trimester: Secondary | ICD-10-CM | POA: Insufficient documentation

## 2024-06-06 DIAGNOSIS — N912 Amenorrhea, unspecified: Secondary | ICD-10-CM | POA: Diagnosis not present

## 2024-06-15 ENCOUNTER — Telehealth: Payer: Self-pay | Admitting: Cardiology

## 2024-06-15 NOTE — Telephone Encounter (Signed)
 Patient called inquiring about her blood type. She wants to have this checked. Please place lab orders.

## 2024-07-04 DIAGNOSIS — Z1329 Encounter for screening for other suspected endocrine disorder: Secondary | ICD-10-CM | POA: Diagnosis not present

## 2024-07-04 DIAGNOSIS — R87611 Atypical squamous cells cannot exclude high grade squamous intraepithelial lesion on cytologic smear of cervix (ASC-H): Secondary | ICD-10-CM | POA: Diagnosis not present

## 2024-07-04 DIAGNOSIS — O0991 Supervision of high risk pregnancy, unspecified, first trimester: Secondary | ICD-10-CM | POA: Diagnosis not present

## 2024-07-04 DIAGNOSIS — Z113 Encounter for screening for infections with a predominantly sexual mode of transmission: Secondary | ICD-10-CM | POA: Diagnosis not present

## 2024-07-04 DIAGNOSIS — N898 Other specified noninflammatory disorders of vagina: Secondary | ICD-10-CM | POA: Diagnosis not present

## 2024-07-04 DIAGNOSIS — A609 Anogenital herpesviral infection, unspecified: Secondary | ICD-10-CM | POA: Diagnosis not present

## 2024-07-04 DIAGNOSIS — Z131 Encounter for screening for diabetes mellitus: Secondary | ICD-10-CM | POA: Diagnosis not present

## 2024-07-04 DIAGNOSIS — Z124 Encounter for screening for malignant neoplasm of cervix: Secondary | ICD-10-CM | POA: Diagnosis not present

## 2024-07-11 DIAGNOSIS — Z131 Encounter for screening for diabetes mellitus: Secondary | ICD-10-CM | POA: Diagnosis not present

## 2024-07-11 DIAGNOSIS — O0991 Supervision of high risk pregnancy, unspecified, first trimester: Secondary | ICD-10-CM | POA: Diagnosis not present

## 2024-07-13 ENCOUNTER — Other Ambulatory Visit: Payer: Self-pay | Admitting: Obstetrics

## 2024-07-13 DIAGNOSIS — O285 Abnormal chromosomal and genetic finding on antenatal screening of mother: Secondary | ICD-10-CM

## 2024-07-15 ENCOUNTER — Other Ambulatory Visit: Payer: Self-pay

## 2024-07-18 ENCOUNTER — Ambulatory Visit: Payer: Self-pay | Admitting: Obstetrics

## 2024-07-18 ENCOUNTER — Ambulatory Visit: Attending: Obstetrics | Admitting: Obstetrics and Gynecology

## 2024-07-18 DIAGNOSIS — O285 Abnormal chromosomal and genetic finding on antenatal screening of mother: Secondary | ICD-10-CM

## 2024-07-18 DIAGNOSIS — O09521 Supervision of elderly multigravida, first trimester: Secondary | ICD-10-CM | POA: Diagnosis not present

## 2024-07-18 DIAGNOSIS — Z3A12 12 weeks gestation of pregnancy: Secondary | ICD-10-CM | POA: Diagnosis not present

## 2024-07-18 NOTE — Progress Notes (Cosign Needed Addendum)
 Virtual Visit via Video Note  I connected with Allison Delgado on 07/18/24 at  2:30 PM EDT by a video enabled telemedicine application and verified that I am speaking with the correct person using two identifiers.  Location: Patient: home ([place]) Provider: [facility] at    Referring provider: Bobbette Brunswick, CNM, [facility] Length of consultation: 40 minutes virtual visit  Allison Delgado was referred for genetic counseling to review lab results and testing options, as her noninvasive prenatal screening (NIPS) revealed an increased risk of trisomy 73 in the current pregnancy. The patient was present on this virtual visit alone.  Abnormal MaterniT21 results: Cell-free DNA (cfDNA) screening, also known as noninvasive prenatal screening (NIPS), analyzes cell-free DNA originating from the placenta that is found in the maternal blood circulation during pregnancy to provide information regarding the presence or absence of extra DNA for chromosomes 13, 18, and 21 as well as the sex chromosomes. 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 The positive predictive value (PPV) of this result is estimated to be 54.7%. The finding could also be due to normal variation and/or confined to the placental tissue.  We reviewed this result in detail with Allison Delgado. We discussed that individuals with full trisomy 50 (non-mosaic) have severe health concerns that involve multiple organ systems. Common features include severe intellectual disability, neurological complications, skeletal defects, heart defects, genitourinary and gastrointestinal defects, and distinct facial features. Most babies with trisomy 60 do not survive past their first year of life. We discussed the possibility of mosaic trisomy 27, in which some of the fetal cells will have trisomy 18 while the rest of the cells will have typical chromosomes. This is  typically less severe than full trisomy 18, as not all the body's cells are affected. However, it is difficult to predict the severity of the symptoms as we cannot test for all cells in the body to assess which specific cells and tissues in the body have trisomy 18. Babies with mosaic Trisomy 18 typically have variable symptoms depending on the extent of the mosaicism. We also discussed the possibility of partial trisomy 78, in which only a partial segment of chromosome 18 has an extra copy. Babies with partial trisomy 59 also have variable symptoms which depend on the amount of extra chromosome 18 material. If the extra chromosome 18 material is confined to the placenta and is not present in the fetus, then we do not expect developmental differences or physical birth defects as a result.  We discussed that prenatal diagnosis will a provide a karyotype that can help inform us  of presence or type of chromosome difference, as well as recurrence risks for future pregnancies. Most cases of trisomy 18 occur by random chance and are typically not inherited from the parent(s); however, there are cases of inherited trisomy 47. We discussed that if Allison Delgado opted for diagnostic testing and results confirmed a diagnosis of trisomy 28, it could impact pregnancy management in several different ways. Some individuals may choose to terminate a pregnancy or consider adoption if a diagnosis of trisomy 12 were confirmed. For individuals who would not alter their pregnancy management regardless of testing outcomes, a prenatal diagnosis could allow for delivery planning and prenatal consults with various specialists that would be involved in the infant's care as well as time to plan and prepare emotionally, physically, and financially. Allison Delgado was also made aware that she has the option of continuing to  monitor the pregnancy with routine ultrasounds and pursue genetic testing postnatally. Allison Delgado shared that she would consider the  option of pregnancy termination if the baby is diagnosed with a chromosome condition.  The options of both CVS and amniocentesis were discussed in detail.  Given the fact that the NIPS testing is an assessment of placental cells, the amniocentesis may be more informative because it will assess tissue directly from the fetus.  We reviewed the technical aspects of these diagnostic tests as well as the benefits, risks, and limitations including the 1 in 500 chance for miscarriage from the procedures. We discussed that babies with chromosome differences are at a higher chance of miscarriage independent of any procedures performed.  Advanced Maternal Age. Genetic counseling reviewed with Allison Delgado that as a woman ages, the risk for certain chromosomal conditions, such as Trisomy 75 (Down syndrome), Trisomy 13, and Trisomy 18 increases. These conditions often are not inherited, but instead occur due to an error in chromosomal division during the formation of sperm and egg cells in a process called nondisjunction. With delivery at 38 years, Allison Delgado's age-related risk to have a pregnancy with Down syndrome is 1/56 and risk to have a pregnancy with any chromosome condition is 1/96, though these numbers are different based upon the NIPS results as described above.  Family history and pregnancy history: We obtained a detailed family history and pregnancy history.  This is the fourth pregnancy for Allison Delgado, the first with her current partner, Allison Delgado.  She has two healthy daughters, ages 67 and 10 years and had one elective termination for personal reasons.  Allison Delgado has a healthy 56 year old daughter. Allison Delgado reported no complications or exposure to medications, alcohol, tobacco or recreational drugs in this pregnancy that would be expected to increase the risk for birth defects. The family history is remarkable for spina bifida in her maternal half sisters son.  We reviewed the multifactorial inheritance of neural tube defects  as well as the use of ultrasound, maternal serum screening and amniocentesis to assess for this in pregnancy. She also reported a first cousin once removed with autism spectrum disorder. Autism Spectrum Disorder affects approximately 1-2% of the general population in the [place] , [place], and [place]. Autism is a neurological and developmental disorder that affects how people interact with others, communicate, learn, and behave. Autism is known as a spectrum disorder because there is wide variation in the type and severity of symptoms people experience. Genetic testing for individuals with a clinical diagnosis of autism yields an explanation in only about 20% of cases, and the remaining 80% of cases are left with unknown etiology. In the absence of a known genetic cause in the affected family member, we are unable to test directly for autism in pregnancy. We did review the option of carrier testing for Fragile X syndrome, the most common inherited cause for intellectual delays which can have autism as a feature.  Given the distant relation of the individual(s) with Autism to the current pregnancy, the risk for the current pregnancy to also have Autism is not likely to be increased above the general population risk.  The remainder of the family history was unremarkable for birth defects, developmental delays, recurrent pregnancy loss or known genetic conditions.  Thank you for the opportuinty to be involved with this family. We may be reached at [phone].  Plan of care: Allison Delgado is scheduled for an ultrasound on Friday 7/25 at [facility].   At this time, she would like to plan  for an amniocentesis at 16-[redacted] weeks gestation and schedule a detailed fetal anatomy ultrasound at [redacted] weeks gestation. Allison Delgado was clear that if the baby were to be found to have Trisomy 18, termination of pregnancy is a consideration.  We therefore reviewed the legal limits for termination in [place] and [place].   In total, 75 minutes  were spent on the date of the encounter in service to the patient including preparation, record review, literature review, virtual video consultation, documentation and care coordination.  Allison Delgado

## 2024-07-21 DIAGNOSIS — O285 Abnormal chromosomal and genetic finding on antenatal screening of mother: Secondary | ICD-10-CM | POA: Insufficient documentation

## 2024-07-22 ENCOUNTER — Ambulatory Visit: Attending: Obstetrics and Gynecology | Admitting: Obstetrics and Gynecology

## 2024-07-22 ENCOUNTER — Other Ambulatory Visit: Payer: Self-pay | Admitting: Obstetrics

## 2024-07-22 ENCOUNTER — Ambulatory Visit: Payer: Self-pay

## 2024-07-22 VITALS — BP 120/64 | HR 78

## 2024-07-22 DIAGNOSIS — Z3A13 13 weeks gestation of pregnancy: Secondary | ICD-10-CM | POA: Diagnosis not present

## 2024-07-22 DIAGNOSIS — O09291 Supervision of pregnancy with other poor reproductive or obstetric history, first trimester: Secondary | ICD-10-CM | POA: Insufficient documentation

## 2024-07-22 DIAGNOSIS — O99011 Anemia complicating pregnancy, first trimester: Secondary | ICD-10-CM | POA: Diagnosis not present

## 2024-07-22 DIAGNOSIS — F17210 Nicotine dependence, cigarettes, uncomplicated: Secondary | ICD-10-CM

## 2024-07-22 DIAGNOSIS — O99331 Smoking (tobacco) complicating pregnancy, first trimester: Secondary | ICD-10-CM | POA: Diagnosis not present

## 2024-07-22 DIAGNOSIS — O09521 Supervision of elderly multigravida, first trimester: Secondary | ICD-10-CM

## 2024-07-22 DIAGNOSIS — O09211 Supervision of pregnancy with history of pre-term labor, first trimester: Secondary | ICD-10-CM | POA: Insufficient documentation

## 2024-07-22 DIAGNOSIS — O285 Abnormal chromosomal and genetic finding on antenatal screening of mother: Secondary | ICD-10-CM

## 2024-07-22 DIAGNOSIS — O99211 Obesity complicating pregnancy, first trimester: Secondary | ICD-10-CM | POA: Diagnosis not present

## 2024-07-22 DIAGNOSIS — O09299 Supervision of pregnancy with other poor reproductive or obstetric history, unspecified trimester: Secondary | ICD-10-CM

## 2024-07-22 NOTE — Progress Notes (Signed)
 Maternal-Fetal Medicine Consultation Name: Allison Delgado MRN: [id number]  [facility] [id number] at 13w 5d gestation.  Patient is here for first trimester anatomy. Advanced maternal age.  On cell-free fetal DNA screening, the risk for trisomy 18 was increased.  She had genetic counseling and is planning to have amniocentesis.  You will be receiving a separate letter from our genetic counselor.  Obstetrical history significant for 2 preterm deliveries and both her children are in good health.  This pregnancy is from a new partner.  Patient reports no chronic medical conditions.  Ultrasound We performed both transabdominal and transvaginal ultrasound to evaluate the fetus. The CRL measurement lags gestational age by 1 week.  Good fetal heart activity seen.  Findings include - The skull and lateral ventricles appear normal. -Fetal profile appears normal and nasal bone is seen.  The nuchal translucency could not be measured but there is no evidence of cystic hygroma.  Intracranial translucency is not seen. - Cardiac anatomy could not be evaluated but appears abnormal. - The stomach bubble is seen. - A small abdominal wall defect consistent with omphalocele is seen (8 mm x 6 mm). 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  It appears solid with no increase on color Doppler. Differential diagnoses include sacrococcygeal teratoma.  Abnormal Cell-free fetal DNA screening result I discussed abnormal ultrasound findings and that trisomy fetuses have increased structural defects.  I discussed the option of chorionic villous sampling (cannot be performed today; [facility] is closed).  Alternatively, amniocentesis can be performed at [redacted] weeks gestation.  A return visit in 16 weeks will give us  an opportunity to confirm open spina bifida. I explained amniocentesis procedure and possible complication of miscarriage (1 and 500 procedures).  I counseled the patient that chromosomally abnormal fetus has a  higher likelihood of miscarriage that is independent of the procedure.  Trisomy 18 fetus has a very high incidence of intrauterine demise.  Patient will consider termination of pregnancy if trisomy 41 is confirmed.  She asked about the procedure, and I explained D&E procedure.  I briefly discussed [place]  State law that permits termination of pregnancy for life limiting anomalies up to [redacted] weeks gestation.  Recommendations - An appointment was made for her to return in 2 weeks for amniocentesis.    Consultation including face-to-face (more than 50%) counseling 30 minutes.

## 2024-07-25 ENCOUNTER — Telehealth: Payer: Self-pay

## 2024-07-25 ENCOUNTER — Other Ambulatory Visit: Payer: Self-pay | Admitting: *Deleted

## 2024-07-25 DIAGNOSIS — O3512X0 Maternal care for (suspected) chromosomal abnormality in fetus, trisomy 18, not applicable or unspecified: Secondary | ICD-10-CM

## 2024-07-25 DIAGNOSIS — O285 Abnormal chromosomal and genetic finding on antenatal screening of mother: Secondary | ICD-10-CM

## 2024-07-27 DIAGNOSIS — O09529 Supervision of elderly multigravida, unspecified trimester: Secondary | ICD-10-CM | POA: Insufficient documentation

## 2024-08-08 ENCOUNTER — Ambulatory Visit: Attending: Obstetrics and Gynecology

## 2024-08-08 ENCOUNTER — Ambulatory Visit (HOSPITAL_BASED_OUTPATIENT_CLINIC_OR_DEPARTMENT_OTHER)

## 2024-08-08 ENCOUNTER — Telehealth: Payer: Self-pay

## 2024-08-08 ENCOUNTER — Ambulatory Visit (HOSPITAL_BASED_OUTPATIENT_CLINIC_OR_DEPARTMENT_OTHER): Admitting: Obstetrics and Gynecology

## 2024-08-08 ENCOUNTER — Ambulatory Visit

## 2024-08-08 VITALS — BP 118/60 | HR 74

## 2024-08-08 DIAGNOSIS — O289 Unspecified abnormal findings on antenatal screening of mother: Secondary | ICD-10-CM | POA: Insufficient documentation

## 2024-08-08 DIAGNOSIS — Z3A16 16 weeks gestation of pregnancy: Secondary | ICD-10-CM

## 2024-08-08 DIAGNOSIS — O09529 Supervision of elderly multigravida, unspecified trimester: Secondary | ICD-10-CM

## 2024-08-08 DIAGNOSIS — O09522 Supervision of elderly multigravida, second trimester: Secondary | ICD-10-CM | POA: Diagnosis not present

## 2024-08-08 DIAGNOSIS — O285 Abnormal chromosomal and genetic finding on antenatal screening of mother: Secondary | ICD-10-CM

## 2024-08-08 DIAGNOSIS — O3512X0 Maternal care for (suspected) chromosomal abnormality in fetus, trisomy 18, not applicable or unspecified: Secondary | ICD-10-CM | POA: Diagnosis not present

## 2024-08-08 DIAGNOSIS — O283 Abnormal ultrasonic finding on antenatal screening of mother: Secondary | ICD-10-CM | POA: Diagnosis not present

## 2024-08-08 NOTE — Progress Notes (Signed)
    [facility] at [facility] [address] Phone:  [phone]   Fax:  [phone]      In-Person Genetic Counseling Clinic Note:   I spoke with 38 y.o. Lorenna Lurry today to discuss genetic testing due to the high-risk NIPS results. She was referred by Aisha Heller, CNM. She was accompanied by FOB.  The patient had prenatal genetic counseling with Barnie Dixons on 07/18/2024. They discussed the high-risk NIPS results for trisomy 18 as well as available testing options. They also reviewed pertinent pregnancy and family histories. Please refer to that note for details.  Today, amniocentesis was performed by Dr. Arna, MFM due to the NIPS results and abnormal fetal ultrasound. Please see the ultrasound report for details. We reviewed the diagnostic tests that can be ordered on the amniotic fluid sample, such as FISH, karyotype with reflex to chromosomal microarray, and AF-AFP. We reviewed the benefits and limitations of each. We reviewed AF-AFP can confirm the presence of spina bifida. We discussed that FISH is a preliminary test and is not considered diagnostic on its own as it can be associated with false positives/negatives; therefore, it is typically not recommended to pursue irreversible pregnancy decisions based solely on FISH results. However, given the ultrasound findings, some patients may elect to proceed with TOP without any diagnostic testing. If desired, we will refer the patient for TOP. We also discussed that a final karyotype, if positive for T18, can inform on recurrence risks for a future pregnancy.  We reviewed TOP options in [place]  and [place]  and discussed that a 72 hour consent form is required for TOP in [place]. The patient stated she would not want IOL and would prefer a D&E.  Previous Testing Completed:  Negative carrier screening: Samaia previously completed [facility] carrier screening. She screened to  not be a carrier for cystic fibrosis (CF) or spinal muscular atrophy (SMA). Please see report for details. A negative result on carrier screening reduces but does not eliminate the chance of being a carrier. Moreover, her hemoglobin fractionation cascade was within normal limits; no hemoglobin variant or beta thalassemia was detected. This does not screen for alpha thalassemia.   Plan of Care:   FISH, karyotype with reflex to microarray, and AF-AFP were ordered on the amniotic fluid sample. Maternal cell contamination drawn.   Informed consent was obtained. All questions were answered.   45 minutes were spent on the date of the encounter in service to the patient including preparation, face-to-face consultation, discussion of test reports and available next steps, pedigree construction, genetic risk assessment, documentation, and care coordination.    Thank you for sharing in the care of Darly with us .  Please do not hesitate to contact us  at [phone] if you have any questions.   Lauraine Bodily, MS, [facility] Certified Genetic Counselor   Genetic counseling student involved in appointment: No.

## 2024-08-08 NOTE — Progress Notes (Signed)
 Maternal-Fetal Medicine Consultation Name: Allison Delgado MRN: [id number]  G4 [id number] at 16w 1d gestation.  Patient returned for amniocentesis. On cell-free fetal DNA screening, the risk for trisomy 18 was increased.  Patient had genetic counseling in the first trimester.  At first trimester ultrasound at our office, fetal spina bifida was suspected.  Ultrasound Amniotic fluid is normal and good fetal activity seen.  Scalloping of frontal bone and mild curvature of the cerebellum ("banana sign") were seen. 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 Four-chamber view appears abnormal and I cannot rule out the possibility of left hypoplastic heart. A small omphalocele, measuring 7 mm x 6 mm, was seen.  I explained the findings.  Ultrasound has a greater sensitivity for diagnosis spina bifida.  I discussed amniocentesis and possible complication of miscarriage (1 and 500 procedures).  Chromosomally anomalous fetus has a higher likelihood of miscarriage that is independent of the procedure.  Patient is aware that a trisomy 18 fetus has a higher likelihood of fetal demise.  After informed consent, amniocentesis was performed by Dr. Arna under ultrasound guidance, and 38 milliliters of amniotic fluid was withdrawn.  Initial 3 mL were discarded to prevent maternal cell contamination.  Amniotic fluid was sent for AFAFP, FISH, fetal karyotype and reflex microarray analysis.  Patient tolerated the procedure well.  Postprocedure fetal heart was normal.  We gave her postprocedure instructions. Our genetic counselor met with the couple today and discussed the test to be performed.  Patient had questions about termination of pregnancy that were answered.  We briefly discussed intrauterine surgery for open spina bifida that is performed only in chromosomally normal fetuses.   Recommendations: - Will communicate the results to the patient  and fax copies to your office.  Consultation including face-to-face (more than 50%) counseling 20 minutes.

## 2024-08-09 ENCOUNTER — Ambulatory Visit

## 2024-08-09 ENCOUNTER — Other Ambulatory Visit

## 2024-08-10 ENCOUNTER — Telehealth: Payer: Self-pay

## 2024-08-10 NOTE — Telephone Encounter (Signed)
 Attempted to call patient with FISH and AF-AFP results. Left message with callback number.

## 2024-08-10 NOTE — Telephone Encounter (Signed)
 Patient called back.   We reviewed the preliminary results from amniocentesis.  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 We reviewed that FISH is still considered a screen; however, given her ultrasound and NIPS results, the fetus is likely to be affected with T18. Karyotype is still pending to confirm the fetal chromosomes. We reviewed recommendations regarding irreversible pregnancy decisions in cases of abnormal FISH results. The patient would like to proceed with TOP. We will refer her to [facility].  AF-AFP was elevated at 5.66 MoM which confirms the presence of spina bifida in the fetus. Follow-up acetylcholinesterase (AChE) and fetal hemoglobin are pending.  She is also aware of referral that was sent to [facility] from [place].  Lauraine Bodily, MS, [facility] Certified Genetic Counselor [facility] [phone]

## 2024-08-12 ENCOUNTER — Ambulatory Visit: Attending: Obstetrics and Gynecology

## 2024-08-12 ENCOUNTER — Ambulatory Visit: Payer: Self-pay

## 2024-08-12 NOTE — Progress Notes (Signed)
 Allison Delgado was present today to sign the 72-hour [place] consent form for abortion. Her friend was also present. The consent form was reviewed with the patient by Dr. Arna, MFM. She has been referred to [facility] for the procedure. The forms have been uploaded to her chart.   Informed consent was obtained. All questions were answered.   Lauraine Bodily, MS, [facility] Certified Genetic Counselor

## 2024-08-15 ENCOUNTER — Telehealth: Payer: Self-pay

## 2024-08-16 DIAGNOSIS — Q913 Trisomy 18, unspecified: Secondary | ICD-10-CM | POA: Diagnosis not present

## 2024-08-17 DIAGNOSIS — O029 Abnormal product of conception, unspecified: Secondary | ICD-10-CM | POA: Diagnosis not present

## 2024-08-17 DIAGNOSIS — O358XX1 Maternal care for other (suspected) fetal abnormality and damage, fetus 1: Secondary | ICD-10-CM | POA: Diagnosis not present

## 2024-08-17 DIAGNOSIS — Q913 Trisomy 18, unspecified: Secondary | ICD-10-CM | POA: Diagnosis not present

## 2024-08-17 DIAGNOSIS — Z3A17 17 weeks gestation of pregnancy: Secondary | ICD-10-CM | POA: Diagnosis not present

## 2024-08-19 LAB — MCC TRACKING

## 2024-08-22 NOTE — Telephone Encounter (Signed)
 I called the patient to review results from amniocentesis.  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 We reviewed that there was no evidence of an inherited translocation, and this most likely occurred due to random events of nondisjunction of the reproductive cells. Recurrence risk is estimated to be the age-related risk. For example, she would have a 1.6% chance of having another pregnancy affected with a chromosome difference if she is 38 yo at the time of delivery.  FISH returned as female with three signals for chromosome 34, indicating trisomy 59.   AF-AFP was elevated at 5.66 MoM. Follow-up acetylcholinesterase (AChE) was positive and fetal hemoglobin was negative. This confirmed the presence of spina bifida in the fetus.  Allison Delgado had TOP procedure last week and is recovering well. She is looking forward to speaking with [facility] for grief counseling.  Lauraine Bodily, MS, [facility] Certified Genetic Counselor [facility] [phone]

## 2024-08-25 LAB — MCC TRACKING

## 2024-08-26 LAB — ACHE+HB F: ACHE, Amn: POSITIVE — AB

## 2024-08-26 LAB — CHROMOSOME ANALYSIS W REFLEX TO SNP, AMNIOTIC
Cells Analyzed: 15
Cells Counted: 15
Cells Karyotyped: 2
Colonies: 15
GTG Band Resolution Achieved: 450

## 2024-08-26 LAB — AFP, AMNIOTIC FLUID
AFP, Amniotic Fluid (mcg/ml): 70.8 ug/mL
Gestational Age(Wks): 16
MOM, Amniotic Fluid: 5.66

## 2024-08-26 LAB — INSIGHT AMNIO FISH XY,13,18,21
Cells Analyzed: 50
Cells Counted: 50

## 2024-08-26 LAB — MATERNAL CELL CONTAMINATION

## 2024-12-01 ENCOUNTER — Other Ambulatory Visit

## 2024-12-05 ENCOUNTER — Other Ambulatory Visit: Payer: Self-pay | Admitting: Internal Medicine

## 2024-12-09 ENCOUNTER — Ambulatory Visit: Admit: 2024-12-09 | Admitting: Obstetrics and Gynecology

## 2024-12-09 SURGERY — SALPINGECTOMY, BILATERAL, LAPAROSCOPIC
Anesthesia: General | Site: Pelvis | Laterality: Bilateral

## 2024-12-11 ENCOUNTER — Other Ambulatory Visit: Payer: Self-pay | Admitting: Internal Medicine

## 2024-12-12 ENCOUNTER — Other Ambulatory Visit: Payer: Self-pay

## 2024-12-13 ENCOUNTER — Other Ambulatory Visit: Payer: Self-pay | Admitting: Internal Medicine

## 2024-12-14 ENCOUNTER — Encounter: Payer: Self-pay | Admitting: Cardiology

## 2024-12-14 ENCOUNTER — Ambulatory Visit: Payer: MEDICAID | Admitting: Cardiology

## 2024-12-14 VITALS — BP 118/78 | HR 87 | Ht 59.0 in | Wt 167.6 lb

## 2024-12-14 DIAGNOSIS — Z13220 Encounter for screening for lipoid disorders: Secondary | ICD-10-CM | POA: Diagnosis not present

## 2024-12-14 DIAGNOSIS — Z713 Dietary counseling and surveillance: Secondary | ICD-10-CM | POA: Insufficient documentation

## 2024-12-14 DIAGNOSIS — Z0130 Encounter for examination of blood pressure without abnormal findings: Secondary | ICD-10-CM

## 2024-12-14 DIAGNOSIS — E6609 Other obesity due to excess calories: Secondary | ICD-10-CM | POA: Insufficient documentation

## 2024-12-14 DIAGNOSIS — Z6833 Body mass index (BMI) 33.0-33.9, adult: Secondary | ICD-10-CM | POA: Diagnosis not present

## 2024-12-14 DIAGNOSIS — B009 Herpesviral infection, unspecified: Secondary | ICD-10-CM | POA: Diagnosis not present

## 2024-12-14 DIAGNOSIS — Z131 Encounter for screening for diabetes mellitus: Secondary | ICD-10-CM

## 2024-12-14 DIAGNOSIS — E66811 Obesity, class 1: Secondary | ICD-10-CM | POA: Diagnosis not present

## 2024-12-14 DIAGNOSIS — Z1329 Encounter for screening for other suspected endocrine disorder: Secondary | ICD-10-CM | POA: Diagnosis not present

## 2024-12-14 MED ORDER — WEGOVY 0.25 MG/0.5ML ~~LOC~~ SOAJ: 0.2500 mg | SUBCUTANEOUS | 3 refills | Status: DC

## 2024-12-14 MED ORDER — VALACYCLOVIR HCL 500 MG PO TABS: 500.0000 mg | ORAL_TABLET | Freq: Every day | ORAL | 1 refills | Status: AC

## 2024-12-14 NOTE — Progress Notes (Signed)
 Established Patient Office Visit  Subjective:  Patient ID: Allison Delgado, female    DOB: September 18, 1986  Age: 38 y.o. MRN: [id number]  Chief Complaint  Patient presents with   Follow-up    Medication refills    Patient in office for regular follow up. Patient doing well, no complaints today. Of note, patient is no longer pregnant. Had D&E 08/17/24. Due for lab work, will do today.  Patient interested in trying a GLP1 to help with weight loss. Will send in [facility].  Will reassess in 4 weeks.     No other concerns at this time.   Past Medical History:  Diagnosis Date   Abnormal chromosomal and genetic finding on antenatal screening of mother 07/21/2024   Abnormal fetal ultrasound 08/08/2024   Amnio performed 8/11.     AMA (advanced maternal age) multigravida 35+ 07/27/2024   Anemia    Gestational diabetes 02/10/2020   Per [facility] chart     Herpes simplex virus (HSV) infection    Mental disorder    Supervision of high risk pregnancy, antepartum 06/06/2024   38 y.o. G4P0210 at  Patient's last menstrual period was 04/17/2024 (exact date). consistent with  ultrasound on 06/06/24 @ [redacted]w[redacted]d. Estimated Date of Delivery: 01/22/25  Sex of baby and name:      Partner:  Ryan    Factors complicating this pregnancy   Advanced Maternal Age  Age at delivery:  13  Genetic Screening:  [x]  >35 yo  EFW and AFI at 38-39 weeks  Delivery 39+0 to 40+0  []  >10 yo  Growth u/s e   Vaginal Pap smear, abnormal     Past Surgical History:  Procedure Laterality Date   NO PAST SURGERIES      Social History   Socioeconomic History   Marital status: Single    Spouse name: Not on file   Number of children: 2   Years of education: 9   Highest education level: 9th grade  Occupational History   Not on file  Tobacco Use   Smoking status: Light Smoker    Current packs/day: 0.10    Types: Cigarettes   Smokeless tobacco: Never   Tobacco comments:    CBD Gummies  Vaping Use   Vaping status: Never Used   Substance and Sexual Activity   Alcohol use: Yes    Comment: twice a week   Drug use: No   Sexual activity: Yes    Birth control/protection: Pill  Other Topics Concern   Not on file  Social History Narrative   Not on file   Social Drivers of Health   Tobacco Use: High Risk (12/14/2024)   Patient History    Smoking Tobacco Use: Light Smoker    Smokeless Tobacco Use: Never    Passive Exposure: Not on file  Financial Resource Strain: Low Risk  (07/04/2024)   Received from [facility]   Overall Financial Resource Strain (CARDIA)    Difficulty of Paying Living Expenses: Not very hard  Food Insecurity: No Food Insecurity (07/04/2024)   Received from [facility]   Epic    Within the past 12 months, you worried that your food would run out before you got the money to buy more.: Never true    Within the past 12 months, the food you bought just didn't last and you didn't have money to get more.: Never true  Transportation Needs: No Transportation Needs (07/04/2024)   Received from [facility]  PRAPARE - Transportation    In the past 12 months, has lack of transportation kept you from medical appointments or from getting medications?: No    Lack of Transportation (Non-Medical): No  Physical Activity: Not on file  Stress: Not on file  Social Connections: Not on file  Intimate Partner Violence: Not on file  Depression (EYV7-0): Not on file  Alcohol Screen: Not on file  Housing: Low Risk  (07/04/2024)   Received from [facility]   Epic    In the last 12 months, was there a time when you were not able to pay the mortgage or rent on time?: No    In the past 12 months, how many times have you moved where you were living?: 0    At any time in the past 12 months, were you homeless or living in a shelter (including now)?: No  Utilities: Not At Risk (07/04/2024)   Received from [facility]   Epic    In the  past 12 months has the electric, gas, oil, or water company threatened to shut off services in your home?: No  Health Literacy: Not on file    Family History  Problem Relation Age of Onset   Hypertension Maternal Grandmother    Diabetes Maternal Grandmother    Cancer Maternal Grandmother        breast   Diabetes Maternal Grandfather    Hypertension Mother    Depression Mother    Anxiety disorder Mother    Bipolar disorder Mother    Kidney disease Maternal Aunt    Cancer Cousin        lung   ADD / ADHD Daughter     Allergies[1]  Show/hide medication list[2]  Review of Systems  Constitutional: Negative.   HENT: Negative.    Eyes: Negative.   Respiratory: Negative.  Negative for shortness of breath.   Cardiovascular: Negative.  Negative for chest pain.  Gastrointestinal: Negative.  Negative for abdominal pain, constipation and diarrhea.  Genitourinary: Negative.   Musculoskeletal:  Negative for joint pain and myalgias.  Skin: Negative.   Neurological: Negative.  Negative for dizziness and headaches.  Endo/Heme/Allergies: Negative.   All other systems reviewed and are negative.      Objective:   BP 118/78   Pulse 87   Ht 4' 11 (1.499 m)   Wt 167 lb 9.6 oz (76 kg)   LMP 04/17/2024   SpO2 98%   BMI 33.85 kg/m   Vitals:   12/14/24 1033  BP: 118/78  Pulse: 87  Height: 4' 11 (1.499 m)  Weight: 167 lb 9.6 oz (76 kg)  SpO2: 98%  BMI (Calculated): 33.83    Physical Exam Vitals and nursing note reviewed.  Constitutional:      Appearance: Normal appearance. She is normal weight.  HENT:     Head: Normocephalic and atraumatic.     Nose: Nose normal.     Mouth/Throat:     Mouth: Mucous membranes are moist.  Eyes:     Extraocular Movements: Extraocular movements intact.     Conjunctiva/sclera: Conjunctivae normal.     Pupils: Pupils are equal, round, and reactive to light.  Cardiovascular:     Rate and Rhythm: Normal rate and regular rhythm.     Pulses:  Normal pulses.     Heart sounds: Normal heart sounds.  Pulmonary:     Effort: Pulmonary effort is normal.     Breath sounds: Normal breath sounds.  Abdominal:  General: Abdomen is flat. Bowel sounds are normal.     Palpations: Abdomen is soft.  Musculoskeletal:        General: Normal range of motion.     Cervical back: Normal range of motion.  Skin:    General: Skin is warm and dry.  Neurological:     General: No focal deficit present.     Mental Status: She is alert and oriented to person, place, and time.  Psychiatric:        Mood and Affect: Mood normal.        Behavior: Behavior normal.        Thought Content: Thought content normal.        Judgment: Judgment normal.      No results found for any visits on 12/14/24.  No results found for this or any previous visit (from the past 2160 hours).    Assessment & Plan:  Fasting lab work today [employer]  Problem List Items Addressed This Visit       Other   Weight loss counseling, encounter for - Primary   Class 1 obesity due to excess calories without serious comorbidity with body mass index (BMI) of 33.0 to 33.9 in adult   Relevant Medications   semaglutide-weight management (WEGOVY) 0.25 MG/0.5ML SOAJ SQ injection   HSV-2 infection   Relevant Medications   valACYclovir  (VALTREX ) 500 MG tablet   Other Visit Diagnoses       Diabetes mellitus screening       Relevant Orders   CMP14+EGFR   Hemoglobin A1c     Thyroid  disorder screening       Relevant Orders   TSH     Lipid screening       Relevant Orders   Lipid Profile       Return in about 4 weeks (around 01/11/2025).   Total time spent: 25 minutes. This time includes review of previous notes and results and patient face to face interaction during today's visit.    Jeoffrey Pollen, NP  12/14/2024   This document may have been prepared by Dragon Voice Recognition software and as such may include unintentional dictation errors.     [1]   Allergies Allergen Reactions   Nitrofurantoin     Per patient, heart trouble   Sulfa Antibiotics     Nausea  [2]  Outpatient Medications Prior to Visit  Medication Sig   Prenatal Vit-Fe Fumarate-FA (PRENATAL MULTIVITAMIN) TABS tablet Take 1 tablet by mouth daily at 12 noon.   [DISCONTINUED] valACYclovir  (VALTREX ) 500 MG tablet Take 1 tablet by mouth once daily   Norgestimate-Ethinyl Estradiol Triphasic (TRI-SPRINTEC) 0.18/0.215/0.25 MG-35 MCG tablet Take 1 tablet by mouth daily.   [DISCONTINUED] aspirin EC 81 MG tablet Take 81 mg by mouth daily. Swallow whole. (Patient not taking: Reported on 12/14/2024)   [DISCONTINUED] oseltamivir  (TAMIFLU ) 75 MG capsule Take 1 capsule (75 mg total) by mouth 2 (two) times daily. (Patient not taking: Reported on 09/07/2020)   [DISCONTINUED] TRI-SPRINTEC 0.18/0.215/0.25 MG-35 MCG tablet TAKE 1 TABLET BY MOUTH ONCE DAILY (NEEDS  AN  IN  PERSON  VISIT  PRIOR  TO  FURTHER  REFILLS) (Patient not taking: Reported on 09/07/2020)   [DISCONTINUED] TRI-SPRINTEC 0.18/0.215/0.25 MG-35 MCG tablet Take 1 tablet by mouth once daily   No facility-administered medications prior to visit.

## 2024-12-15 LAB — CMP14+EGFR
ALT: 14 IU/L (ref 0–32)
AST: 17 IU/L (ref 0–40)
Albumin: 4.7 g/dL (ref 3.9–4.9)
Alkaline Phosphatase: 93 IU/L (ref 41–116)
BUN/Creatinine Ratio: 16 (ref 9–23)
BUN: 13 mg/dL (ref 6–20)
Bilirubin Total: 0.6 mg/dL (ref 0.0–1.2)
CO2: 24 mmol/L (ref 20–29)
Calcium: 9.9 mg/dL (ref 8.7–10.2)
Chloride: 102 mmol/L (ref 96–106)
Creatinine, Ser: 0.8 mg/dL (ref 0.57–1.00)
Globulin, Total: 2.8 g/dL (ref 1.5–4.5)
Glucose: 87 mg/dL (ref 70–99)
Potassium: 4.5 mmol/L (ref 3.5–5.2)
Sodium: 139 mmol/L (ref 134–144)
Total Protein: 7.5 g/dL (ref 6.0–8.5)
eGFR: 96 mL/min/1.73 (ref >59)

## 2024-12-15 LAB — HEMOGLOBIN A1C
Est. average glucose Bld gHb Est-mCnc: 105 mg/dL
Hgb A1c MFr Bld: 5.3 % (ref 4.8–5.6)

## 2024-12-15 LAB — LIPID PANEL
Chol/HDL Ratio: 3.2 ratio (ref 0.0–4.4)
Cholesterol, Total: 164 mg/dL (ref 100–199)
HDL: 52 mg/dL (ref >39)
LDL Chol Calc (NIH): 89 mg/dL (ref 0–99)
Triglycerides: 133 mg/dL (ref 0–149)
VLDL Cholesterol Cal: 23 mg/dL (ref 5–40)

## 2024-12-15 LAB — TSH: TSH: 0.708 u[IU]/mL (ref 0.450–4.500)

## 2024-12-19 ENCOUNTER — Ambulatory Visit: Payer: Self-pay | Admitting: Cardiology

## 2025-01-10 ENCOUNTER — Ambulatory Visit: Payer: MEDICAID | Admitting: Cardiology

## 2025-01-11 ENCOUNTER — Ambulatory Visit: Payer: MEDICAID | Admitting: Cardiology

## 2025-01-11 ENCOUNTER — Encounter: Payer: Self-pay | Admitting: Cardiology

## 2025-01-11 VITALS — BP 98/76 | HR 99 | Ht 59.0 in | Wt 163.4 lb

## 2025-01-11 DIAGNOSIS — E66811 Obesity, class 1: Secondary | ICD-10-CM | POA: Diagnosis not present

## 2025-01-11 DIAGNOSIS — Z0130 Encounter for examination of blood pressure without abnormal findings: Secondary | ICD-10-CM

## 2025-01-11 DIAGNOSIS — Z713 Dietary counseling and surveillance: Secondary | ICD-10-CM | POA: Diagnosis not present

## 2025-01-11 DIAGNOSIS — E6609 Other obesity due to excess calories: Secondary | ICD-10-CM

## 2025-01-11 DIAGNOSIS — Z6833 Body mass index (BMI) 33.0-33.9, adult: Secondary | ICD-10-CM | POA: Diagnosis not present

## 2025-01-11 MED ORDER — WEGOVY 1.5 MG PO TABS: 1.5000 mg | ORAL_TABLET | Freq: Every day | ORAL | 0 refills | Status: DC

## 2025-01-11 NOTE — Progress Notes (Signed)
 "  Established Patient Office Visit  Subjective:  Patient ID: Allison Delgado, female    DOB: November 10, 1986  Age: 39 y.o. MRN: [id number]  Chief Complaint  Patient presents with   Follow-up    4 week follow up    Patient in office for 4 week follow up. Patient doing well. Patient started on Wegovy  at previous visit. Taking and tolerating well. Patient interested in oral Wegovy . Will send in today to start after finishing the injections.  Discussed recent lab work, all within normal limits.     No other concerns at this time.   Past Medical History:  Diagnosis Date   Abnormal chromosomal and genetic finding on antenatal screening of mother 07/21/2024   Abnormal fetal ultrasound 08/08/2024   Amnio performed 8/11.     AMA (advanced maternal age) multigravida 35+ 07/27/2024   Anemia    Gestational diabetes 02/10/2020   Per [facility] chart     Herpes simplex virus (HSV) infection    Mental disorder    Supervision of high risk pregnancy, antepartum 06/06/2024   39 y.o. G4P0210 at  Patient's last menstrual period was 04/17/2024 (exact date). consistent with  ultrasound on 06/06/24 @ [redacted]w[redacted]d. Estimated Date of Delivery: 01/22/25  Sex of baby and name:      Partner:  Ryan    Factors complicating this pregnancy   Advanced Maternal Age  Age at delivery:  4  Genetic Screening:  [x]  >35 yo  EFW and AFI at 38-39 weeks  Delivery 39+0 to 40+0  []  >18 yo  Growth u/s e   Vaginal Pap smear, abnormal     Past Surgical History:  Procedure Laterality Date   NO PAST SURGERIES      Social History   Socioeconomic History   Marital status: Single    Spouse name: Not on file   Number of children: 2   Years of education: 9   Highest education level: 9th grade  Occupational History   Not on file  Tobacco Use   Smoking status: Light Smoker    Current packs/day: 0.10    Types: Cigarettes   Smokeless tobacco: Never   Tobacco comments:    CBD Gummies  Vaping Use   Vaping status: Never Used   Substance and Sexual Activity   Alcohol use: Yes    Comment: twice a week   Drug use: No   Sexual activity: Yes    Birth control/protection: Pill  Other Topics Concern   Not on file  Social History Narrative   Not on file   Social Drivers of Health   Tobacco Use: High Risk (01/11/2025)   Patient History    Smoking Tobacco Use: Light Smoker    Smokeless Tobacco Use: Never    Passive Exposure: Not on file  Financial Resource Strain: Low Risk  (07/04/2024)   Received from [facility]   Overall Financial Resource Strain (CARDIA)    Difficulty of Paying Living Expenses: Not very hard  Food Insecurity: No Food Insecurity (07/04/2024)   Received from [facility]   Epic    Within the past 12 months, you worried that your food would run out before you got the money to buy more.: Never true    Within the past 12 months, the food you bought just didn't last and you didn't have money to get more.: Never true  Transportation Needs: No Transportation Needs (07/04/2024)   Received from [facility]   [facility] - Transportation  In the past 12 months, has lack of transportation kept you from medical appointments or from getting medications?: No    Lack of Transportation (Non-Medical): No  Physical Activity: Not on file  Stress: Not on file  Social Connections: Not on file  Intimate Partner Violence: Not on file  Depression (EYV7-0): Not on file  Alcohol Screen: Not on file  Housing: Low Risk  (07/04/2024)   Received from [facility]   Epic    In the last 12 months, was there a time when you were not able to pay the mortgage or rent on time?: No    In the past 12 months, how many times have you moved where you were living?: 0    At any time in the past 12 months, were you homeless or living in a shelter (including now)?: No  Utilities: Not At Risk (07/04/2024)   Received from [facility]   Epic    In the  past 12 months has the electric, gas, oil, or water company threatened to shut off services in your home?: No  Health Literacy: Not on file    Family History  Problem Relation Age of Onset   Hypertension Maternal Grandmother    Diabetes Maternal Grandmother    Cancer Maternal Grandmother        breast   Diabetes Maternal Grandfather    Hypertension Mother    Depression Mother    Anxiety disorder Mother    Bipolar disorder Mother    Kidney disease Maternal Aunt    Cancer Cousin        lung   ADD / ADHD Daughter     Allergies[1]  Show/hide medication list[2]  Review of Systems  Constitutional: Negative.   HENT: Negative.    Eyes: Negative.   Respiratory: Negative.  Negative for shortness of breath.   Cardiovascular: Negative.  Negative for chest pain.  Gastrointestinal: Negative.  Negative for abdominal pain, constipation and diarrhea.  Genitourinary: Negative.   Musculoskeletal:  Negative for joint pain and myalgias.  Skin: Negative.   Neurological: Negative.  Negative for dizziness and headaches.  Endo/Heme/Allergies: Negative.   All other systems reviewed and are negative.      Objective:   BP 98/76   Pulse 99   Ht 4' 11 (1.499 m)   Wt 163 lb 6.4 oz (74.1 kg)   LMP  (LMP Unknown)   SpO2 97%   Breastfeeding Unknown   BMI 33.00 kg/m   Vitals:   01/11/25 1104  BP: 98/76  Pulse: 99  Height: 4' 11 (1.499 m)  Weight: 163 lb 6.4 oz (74.1 kg)  SpO2: 97%  BMI (Calculated): 32.99    Physical Exam Vitals and nursing note reviewed.  Constitutional:      Appearance: Normal appearance. She is normal weight.  HENT:     Head: Normocephalic and atraumatic.     Nose: Nose normal.     Mouth/Throat:     Mouth: Mucous membranes are moist.  Eyes:     Extraocular Movements: Extraocular movements intact.     Conjunctiva/sclera: Conjunctivae normal.     Pupils: Pupils are equal, round, and reactive to light.  Cardiovascular:     Rate and Rhythm: Normal rate and  regular rhythm.     Pulses: Normal pulses.     Heart sounds: Normal heart sounds.  Pulmonary:     Effort: Pulmonary effort is normal.     Breath sounds: Normal breath sounds.  Abdominal:  General: Abdomen is flat. Bowel sounds are normal.     Palpations: Abdomen is soft.  Musculoskeletal:        General: Normal range of motion.     Cervical back: Normal range of motion.  Skin:    General: Skin is warm and dry.  Neurological:     General: No focal deficit present.     Mental Status: She is alert and oriented to person, place, and time.  Psychiatric:        Mood and Affect: Mood normal.        Behavior: Behavior normal.        Thought Content: Thought content normal.        Judgment: Judgment normal.      No results found for any visits on 01/11/25.  Recent Results (from the past 2160 hours)  CMP14+EGFR     Status: None   Collection Time: 12/14/24 10:59 AM  Result Value Ref Range   Glucose 87 70 - 99 mg/dL   BUN 13 6 - 20 mg/dL   Creatinine, Ser 9.19 0.57 - 1.00 mg/dL   eGFR 96 >40 fO/fpw/8.26   BUN/Creatinine Ratio 16 9 - 23   Sodium 139 134 - 144 mmol/L   Potassium 4.5 3.5 - 5.2 mmol/L   Chloride 102 96 - 106 mmol/L   CO2 24 20 - 29 mmol/L   Calcium 9.9 8.7 - 10.2 mg/dL   Total Protein 7.5 6.0 - 8.5 g/dL   Albumin 4.7 3.9 - 4.9 g/dL   Globulin, Total 2.8 1.5 - 4.5 g/dL   Bilirubin Total 0.6 0.0 - 1.2 mg/dL   Alkaline Phosphatase 93 41 - 116 IU/L   AST 17 0 - 40 IU/L   ALT 14 0 - 32 IU/L  Lipid Profile     Status: None   Collection Time: 12/14/24 10:59 AM  Result Value Ref Range   Cholesterol, Total 164 100 - 199 mg/dL   Triglycerides 866 0 - 149 mg/dL   HDL 52 >60 mg/dL   VLDL Cholesterol Cal 23 5 - 40 mg/dL   LDL Chol Calc (NIH) 89 0 - 99 mg/dL   Chol/HDL Ratio 3.2 0.0 - 4.4 ratio    Comment:                                   T. Chol/HDL Ratio                                             Men  Women                               1/2 Avg.Risk  3.4    3.3                                    Avg.Risk  5.0    4.4                                2X Avg.Risk  9.6    7.1  3X Avg.Risk 23.4   11.0   TSH     Status: None   Collection Time: 12/14/24 10:59 AM  Result Value Ref Range   TSH 0.708 0.450 - 4.500 uIU/mL  Hemoglobin A1c     Status: None   Collection Time: 12/14/24 10:59 AM  Result Value Ref Range   Hgb A1c MFr Bld 5.3 4.8 - 5.6 %    Comment:          Prediabetes: 5.7 - 6.4          Diabetes: >6.4          Glycemic control for adults with diabetes: <7.0    Est. average glucose Bld gHb Est-mCnc 105 mg/dL      Assessment & Plan:  Wegovy  1.5 mg daily  Problem List Items Addressed This Visit       Other   Weight loss counseling, encounter for   Class 1 obesity due to excess calories without serious comorbidity with body mass index (BMI) of 33.0 to 33.9 in adult - Primary   Relevant Medications   semaglutide -weight management (WEGOVY ) 1.5 MG tablet    Return in about 7 weeks (around 03/01/2025).   Total time spent: 25 minutes. This time includes review of previous notes and results and patient face to face interaction during today's visit.    Jeoffrey Pollen, NP  01/11/2025   This document may have been prepared by [facility] Voice Recognition software and as such may include unintentional dictation errors.     [1]  Allergies Allergen Reactions   Nitrofurantoin     Per patient, heart trouble   Sulfa Antibiotics     Nausea  [2]  Outpatient Medications Prior to Visit  Medication Sig   Norgestimate-Ethinyl Estradiol Triphasic (TRI-SPRINTEC) 0.18/0.215/0.25 MG-35 MCG tablet Take 1 tablet by mouth daily.   Prenatal Vit-Fe Fumarate-FA (PRENATAL MULTIVITAMIN) TABS tablet Take 1 tablet by mouth daily at 12 noon.   semaglutide -weight management (WEGOVY ) 0.25 MG/0.5ML SOAJ SQ injection Inject 0.25 mg into the skin once a week.   valACYclovir  (VALTREX ) 500 MG tablet Take 1 tablet (500 mg total) by mouth  daily.   No facility-administered medications prior to visit.   "

## 2025-01-22 ENCOUNTER — Inpatient Hospital Stay: Admit: 2025-01-22 | Payer: MEDICAID

## 2025-02-01 ENCOUNTER — Telehealth: Payer: Self-pay

## 2025-02-01 NOTE — Telephone Encounter (Signed)
 Has one more shot for Wegovy  and says she needs to go up on dosage because she is gaining weight and it isn't helping with her hunger.

## 2025-02-02 ENCOUNTER — Other Ambulatory Visit: Payer: Self-pay | Admitting: Cardiology

## 2025-02-02 ENCOUNTER — Telehealth: Payer: Self-pay

## 2025-02-02 MED ORDER — WEGOVY 0.25 MG/0.5ML ~~LOC~~ SOAJ: 0.5000 mg | SUBCUTANEOUS | 3 refills | Status: DC

## 2025-02-03 ENCOUNTER — Other Ambulatory Visit: Payer: Self-pay | Admitting: Cardiology

## 2025-02-03 MED ORDER — WEGOVY 1.5 MG PO TABS: 1.5000 mg | ORAL_TABLET | Freq: Every day | ORAL | 0 refills | Status: AC

## 2025-03-02 ENCOUNTER — Ambulatory Visit: Payer: MEDICAID | Admitting: Cardiology
